# Patient Record
Sex: Female | Born: 1965 | Race: White | Hispanic: No | Marital: Married | Smoking: Never smoker
Health system: Southern US, Community
[De-identification: ages and names within clinical notes are randomized; demographics above are authoritative.]

## PROBLEM LIST (undated history)

## (undated) DIAGNOSIS — I639 Cerebral infarction, unspecified: Secondary | ICD-10-CM

## (undated) DIAGNOSIS — I10 Essential (primary) hypertension: Secondary | ICD-10-CM

## (undated) DIAGNOSIS — R519 Headache, unspecified: Secondary | ICD-10-CM

---

## 1998-11-09 ENCOUNTER — Other Ambulatory Visit: Admission: RE | Admit: 1998-11-09 | Discharge: 1998-11-09 | Payer: Self-pay | Admitting: Gynecology

## 2003-07-04 ENCOUNTER — Other Ambulatory Visit: Payer: Self-pay

## 2010-05-18 ENCOUNTER — Ambulatory Visit: Payer: Self-pay | Admitting: Gastroenterology

## 2010-08-01 ENCOUNTER — Ambulatory Visit: Payer: Self-pay | Admitting: Nephrology

## 2012-09-03 ENCOUNTER — Other Ambulatory Visit: Payer: Self-pay | Admitting: Bariatrics

## 2012-09-03 DIAGNOSIS — G4730 Sleep apnea, unspecified: Secondary | ICD-10-CM

## 2012-09-03 DIAGNOSIS — I10 Essential (primary) hypertension: Secondary | ICD-10-CM

## 2012-09-05 ENCOUNTER — Ambulatory Visit: Payer: Self-pay | Admitting: Bariatrics

## 2012-09-05 LAB — CBC WITH DIFFERENTIAL/PLATELET
Basophil %: 0.8 %
Eosinophil #: 0.2 10*3/uL (ref 0.0–0.7)
Eosinophil %: 2.9 %
HCT: 36.4 % (ref 35.0–47.0)
Lymphocyte %: 38.7 %
MCH: 31 pg (ref 26.0–34.0)
MCV: 89 fL (ref 80–100)
Monocyte #: 0.3 x10 3/mm (ref 0.2–0.9)
Monocyte %: 5.6 %
Neutrophil %: 52 %
Platelet: 236 10*3/uL (ref 150–440)
RBC: 4.12 10*6/uL (ref 3.80–5.20)
RDW: 13.2 % (ref 11.5–14.5)
WBC: 5.8 10*3/uL (ref 3.6–11.0)

## 2012-09-05 LAB — COMPREHENSIVE METABOLIC PANEL
Albumin: 3.8 g/dL (ref 3.4–5.0)
Alkaline Phosphatase: 59 U/L (ref 50–136)
Chloride: 104 mmol/L (ref 98–107)
EGFR (Non-African Amer.): >60
Osmolality: 276 (ref 275–301)
SGPT (ALT): 32 U/L (ref 12–78)

## 2012-09-05 LAB — IRON AND TIBC
Iron Bind.Cap.(Total): 372 ug/dL (ref 250–450)
Iron Saturation: 18 %
Iron: 68 ug/dL (ref 50–170)

## 2012-09-05 LAB — HEMOGLOBIN A1C: Hemoglobin A1C: 5.2 % (ref 4.2–6.3)

## 2012-09-05 LAB — FOLATE: Folic Acid: 31.4 ng/mL (ref 3.1–100.0)

## 2012-09-05 LAB — TSH: Thyroid Stimulating Horm: 1.48 u[IU]/mL

## 2012-09-05 LAB — PROTIME-INR: INR: 1

## 2012-09-16 ENCOUNTER — Other Ambulatory Visit: Payer: Self-pay | Admitting: Bariatrics

## 2012-09-16 ENCOUNTER — Ambulatory Visit
Admission: RE | Admit: 2012-09-16 | Discharge: 2012-09-16 | Disposition: A | Discharge status: Home / Self Care | Payer: BC Managed Care – PPO | Source: Ambulatory Visit | Attending: Bariatrics | Admitting: Bariatrics

## 2012-09-16 DIAGNOSIS — I10 Essential (primary) hypertension: Secondary | ICD-10-CM

## 2012-09-16 DIAGNOSIS — G4730 Sleep apnea, unspecified: Secondary | ICD-10-CM

## 2012-09-17 ENCOUNTER — Ambulatory Visit
Admission: RE | Admit: 2012-09-17 | Discharge: 2012-09-17 | Disposition: A | Discharge status: Home / Self Care | Payer: BC Managed Care – PPO | Source: Ambulatory Visit | Attending: Bariatrics | Admitting: Bariatrics

## 2012-09-17 DIAGNOSIS — I10 Essential (primary) hypertension: Secondary | ICD-10-CM

## 2012-09-17 DIAGNOSIS — G4730 Sleep apnea, unspecified: Secondary | ICD-10-CM

## 2012-10-09 ENCOUNTER — Ambulatory Visit: Payer: Self-pay | Admitting: General Practice

## 2012-11-05 ENCOUNTER — Ambulatory Visit: Payer: Self-pay | Admitting: General Practice

## 2013-01-20 ENCOUNTER — Ambulatory Visit: Payer: Self-pay | Admitting: General Practice

## 2013-02-05 ENCOUNTER — Ambulatory Visit: Payer: Self-pay | Admitting: General Practice

## 2013-03-20 ENCOUNTER — Ambulatory Visit: Payer: Self-pay | Admitting: Family Medicine

## 2015-12-16 DIAGNOSIS — G43009 Migraine without aura, not intractable, without status migrainosus: Secondary | ICD-10-CM | POA: Diagnosis present

## 2016-03-16 ENCOUNTER — Other Ambulatory Visit: Payer: Self-pay | Admitting: Family Medicine

## 2016-03-16 DIAGNOSIS — Z1231 Encounter for screening mammogram for malignant neoplasm of breast: Secondary | ICD-10-CM

## 2016-03-20 ENCOUNTER — Ambulatory Visit: Admission: RE | Admit: 2016-03-20 | Payer: BC Managed Care – PPO | Source: Ambulatory Visit

## 2016-03-28 ENCOUNTER — Ambulatory Visit
Admission: RE | Admit: 2016-03-28 | Discharge: 2016-03-28 | Disposition: A | Discharge status: Home / Self Care | Payer: BC Managed Care – PPO | Source: Ambulatory Visit | Attending: Family Medicine | Admitting: Family Medicine

## 2016-03-28 ENCOUNTER — Encounter: Payer: Self-pay | Admitting: Radiology

## 2016-03-28 DIAGNOSIS — Z1231 Encounter for screening mammogram for malignant neoplasm of breast: Secondary | ICD-10-CM | POA: Insufficient documentation

## 2017-03-14 ENCOUNTER — Other Ambulatory Visit: Payer: Self-pay | Admitting: Neurology

## 2017-03-14 DIAGNOSIS — G44059 Short lasting unilateral neuralgiform headache with conjunctival injection and tearing (SUNCT), not intractable: Secondary | ICD-10-CM

## 2017-03-21 ENCOUNTER — Ambulatory Visit
Admission: RE | Admit: 2017-03-21 | Discharge: 2017-03-21 | Disposition: A | Discharge status: Home / Self Care | Payer: BC Managed Care – PPO | Source: Ambulatory Visit | Attending: Neurology | Admitting: Neurology

## 2017-03-21 DIAGNOSIS — G44059 Short lasting unilateral neuralgiform headache with conjunctival injection and tearing (SUNCT), not intractable: Secondary | ICD-10-CM

## 2017-03-21 DIAGNOSIS — R51 Headache: Secondary | ICD-10-CM | POA: Diagnosis not present

## 2017-03-21 DIAGNOSIS — R4701 Aphasia: Secondary | ICD-10-CM | POA: Diagnosis not present

## 2017-05-03 DIAGNOSIS — R200 Anesthesia of skin: Secondary | ICD-10-CM | POA: Insufficient documentation

## 2019-02-10 ENCOUNTER — Other Ambulatory Visit: Payer: Self-pay | Admitting: Physician Assistant

## 2019-02-10 DIAGNOSIS — Z1231 Encounter for screening mammogram for malignant neoplasm of breast: Secondary | ICD-10-CM

## 2019-03-04 ENCOUNTER — Other Ambulatory Visit: Payer: Self-pay

## 2019-03-04 ENCOUNTER — Ambulatory Visit
Admission: RE | Admit: 2019-03-04 | Discharge: 2019-03-04 | Disposition: A | Discharge status: Home / Self Care | Payer: BC Managed Care – PPO | Source: Ambulatory Visit | Attending: Physician Assistant | Admitting: Physician Assistant

## 2019-03-04 DIAGNOSIS — Z1231 Encounter for screening mammogram for malignant neoplasm of breast: Secondary | ICD-10-CM

## 2020-02-29 ENCOUNTER — Other Ambulatory Visit: Payer: Self-pay

## 2020-02-29 ENCOUNTER — Emergency Department: Payer: BC Managed Care – PPO

## 2020-02-29 ENCOUNTER — Encounter: Payer: Self-pay | Admitting: Intensive Care

## 2020-02-29 ENCOUNTER — Observation Stay
Admission: EM | Admit: 2020-02-29 | Discharge: 2020-03-01 | Disposition: A | Discharge status: Home / Self Care | Payer: BC Managed Care – PPO | Attending: Internal Medicine | Admitting: Internal Medicine

## 2020-02-29 ENCOUNTER — Observation Stay: Payer: BC Managed Care – PPO

## 2020-02-29 DIAGNOSIS — R29818 Other symptoms and signs involving the nervous system: Secondary | ICD-10-CM | POA: Diagnosis not present

## 2020-02-29 DIAGNOSIS — R55 Syncope and collapse: Secondary | ICD-10-CM | POA: Diagnosis not present

## 2020-02-29 DIAGNOSIS — Z79899 Other long term (current) drug therapy: Secondary | ICD-10-CM | POA: Insufficient documentation

## 2020-02-29 DIAGNOSIS — E538 Deficiency of other specified B group vitamins: Secondary | ICD-10-CM | POA: Diagnosis not present

## 2020-02-29 DIAGNOSIS — G43009 Migraine without aura, not intractable, without status migrainosus: Secondary | ICD-10-CM | POA: Diagnosis not present

## 2020-02-29 DIAGNOSIS — I639 Cerebral infarction, unspecified: Secondary | ICD-10-CM | POA: Diagnosis not present

## 2020-02-29 DIAGNOSIS — R410 Disorientation, unspecified: Secondary | ICD-10-CM | POA: Diagnosis present

## 2020-02-29 DIAGNOSIS — I10 Essential (primary) hypertension: Secondary | ICD-10-CM

## 2020-02-29 DIAGNOSIS — Z20822 Contact with and (suspected) exposure to covid-19: Secondary | ICD-10-CM | POA: Insufficient documentation

## 2020-02-29 DIAGNOSIS — G9341 Metabolic encephalopathy: Secondary | ICD-10-CM | POA: Diagnosis not present

## 2020-02-29 DIAGNOSIS — R471 Dysarthria and anarthria: Secondary | ICD-10-CM | POA: Diagnosis not present

## 2020-02-29 DIAGNOSIS — F32A Depression, unspecified: Secondary | ICD-10-CM | POA: Insufficient documentation

## 2020-02-29 DIAGNOSIS — I6781 Acute cerebrovascular insufficiency: Secondary | ICD-10-CM | POA: Diagnosis not present

## 2020-02-29 DIAGNOSIS — R6889 Other general symptoms and signs: Secondary | ICD-10-CM

## 2020-02-29 DIAGNOSIS — R2990 Unspecified symptoms and signs involving the nervous system: Secondary | ICD-10-CM | POA: Diagnosis not present

## 2020-02-29 HISTORY — DX: Cerebral infarction, unspecified: I63.9

## 2020-02-29 HISTORY — DX: Essential (primary) hypertension: I10

## 2020-02-29 HISTORY — DX: Headache, unspecified: R51.9

## 2020-02-29 LAB — CBC
HCT: 38.5 % (ref 36.0–46.0)
HCT: 38.6 % (ref 36.0–46.0)
Hemoglobin: 12.9 g/dL (ref 12.0–15.0)
Hemoglobin: 13 g/dL (ref 12.0–15.0)
MCH: 30.5 pg (ref 26.0–34.0)
MCH: 30.7 pg (ref 26.0–34.0)
MCHC: 33.5 g/dL (ref 30.0–36.0)
MCHC: 33.7 g/dL (ref 30.0–36.0)
MCV: 91 fL (ref 80.0–100.0)
MCV: 91 fL (ref 80.0–100.0)
Platelets: 190 10*3/uL (ref 150–400)
Platelets: 160 10*3/uL (ref 150–400)
RBC: 4.23 MIL/uL (ref 3.87–5.11)
RBC: 4.24 MIL/uL (ref 3.87–5.11)
RDW: 11.9 % (ref 11.5–15.5)
RDW: 11.9 % (ref 11.5–15.5)
WBC: 4.2 10*3/uL (ref 4.0–10.5)
WBC: 4.4 10*3/uL (ref 4.0–10.5)
nRBC: 0 % (ref 0.0–0.2)
nRBC: 0 % (ref 0.0–0.2)

## 2020-02-29 LAB — ACETAMINOPHEN LEVEL: Acetaminophen (Tylenol), Serum: <10 ug/mL — ABNORMAL LOW (ref 10–30)

## 2020-02-29 LAB — PROTIME-INR
INR: 1 (ref 0.8–1.2)
Prothrombin Time: 12.5 seconds (ref 11.4–15.2)

## 2020-02-29 LAB — SARS CORONAVIRUS 2 BY RT PCR (HOSPITAL ORDER, PERFORMED IN ~~LOC~~ HOSPITAL LAB): SARS Coronavirus 2: NEGATIVE

## 2020-02-29 LAB — COMPREHENSIVE METABOLIC PANEL
ALT: 39 U/L (ref 0–44)
AST: 38 U/L (ref 15–41)
Albumin: 4.3 g/dL (ref 3.5–5.0)
Alkaline Phosphatase: 84 U/L (ref 38–126)
Anion gap: 10 (ref 5–15)
BUN: 14 mg/dL (ref 6–20)
CO2: 26 mmol/L (ref 22–32)
Calcium: 9.5 mg/dL (ref 8.9–10.3)
Chloride: 105 mmol/L (ref 98–111)
Creatinine, Ser: 0.8 mg/dL (ref 0.44–1.00)
GFR, Estimated: >60 mL/min (ref >60)
Glucose, Bld: 102 mg/dL — ABNORMAL HIGH (ref 70–99)
Potassium: 4.1 mmol/L (ref 3.5–5.1)
Sodium: 141 mmol/L (ref 135–145)
Total Bilirubin: 0.7 mg/dL (ref 0.3–1.2)
Total Protein: 7.4 g/dL (ref 6.5–8.1)

## 2020-02-29 LAB — ETHANOL: Alcohol, Ethyl (B): <10 mg/dL (ref <10)

## 2020-02-29 LAB — CREATININE, SERUM
Creatinine, Ser: 0.91 mg/dL (ref 0.44–1.00)
GFR, Estimated: >60 mL/min (ref >60)

## 2020-02-29 LAB — POC SARS CORONAVIRUS 2 AG -  ED: SARS Coronavirus 2 Ag: NEGATIVE

## 2020-02-29 LAB — SALICYLATE LEVEL: Salicylate Lvl: <7 mg/dL — ABNORMAL LOW (ref 7.0–30.0)

## 2020-02-29 MED ORDER — SODIUM CHLORIDE 0.9 % IV SOLN: INTRAVENOUS | Status: DC

## 2020-02-29 MED ORDER — IOHEXOL 350 MG/ML SOLN
100.0000 mL | Freq: Once | INTRAVENOUS | Status: AC | PRN
  Administered 2020-02-29: 100 mL via INTRAVENOUS

## 2020-02-29 MED ORDER — ACETAMINOPHEN 160 MG/5ML PO SOLN
650.0000 mg | ORAL | Status: DC | PRN
  Filled 2020-02-29: qty 20.3

## 2020-02-29 MED ORDER — ACETAMINOPHEN 650 MG RE SUPP: 650.0000 mg | RECTAL | Status: DC | PRN

## 2020-02-29 MED ORDER — SUMATRIPTAN SUCCINATE 50 MG PO TABS
50.0000 mg | ORAL_TABLET | Freq: Every day | ORAL | Status: DC | PRN
  Filled 2020-02-29: qty 2

## 2020-02-29 MED ORDER — ASPIRIN EC 81 MG PO TBEC: 81.0000 mg | DELAYED_RELEASE_TABLET | Freq: Every day | ORAL | Status: DC

## 2020-02-29 MED ORDER — STROKE: EARLY STAGES OF RECOVERY BOOK: Freq: Once | Status: DC

## 2020-02-29 MED ORDER — ENOXAPARIN SODIUM 40 MG/0.4ML ~~LOC~~ SOLN
40.0000 mg | SUBCUTANEOUS | Status: DC
  Administered 2020-02-29: 40 mg via SUBCUTANEOUS
  Filled 2020-02-29: qty 0.4

## 2020-02-29 MED ORDER — ASPIRIN 300 MG RE SUPP
300.0000 mg | Freq: Once | RECTAL | Status: AC
  Administered 2020-02-29: 300 mg via RECTAL
  Filled 2020-02-29: qty 1

## 2020-02-29 MED ORDER — ACETAMINOPHEN 325 MG PO TABS: 650.0000 mg | ORAL_TABLET | ORAL | Status: DC | PRN

## 2020-02-29 MED ORDER — GADOBUTROL 1 MMOL/ML IV SOLN
7.0000 mL | Freq: Once | INTRAVENOUS | Status: AC | PRN
  Administered 2020-02-29: 7 mL via INTRAVENOUS

## 2020-02-29 MED ORDER — LACTATED RINGERS IV BOLUS
1000.0000 mL | Freq: Once | INTRAVENOUS | Status: AC
  Administered 2020-02-29: 1000 mL via INTRAVENOUS

## 2020-02-29 MED ORDER — ATORVASTATIN CALCIUM 20 MG PO TABS
40.0000 mg | ORAL_TABLET | Freq: Every day | ORAL | Status: DC
  Administered 2020-02-29: 40 mg via ORAL
  Filled 2020-02-29: qty 2

## 2020-02-29 NOTE — ED Notes (Signed)
Called codee stroke to [facility], Kim  [phone]

## 2020-02-29 NOTE — ED Notes (Signed)
Patient back to room from CT

## 2020-02-29 NOTE — ED Triage Notes (Signed)
First nurse note: Patient to ER from home via [facility] for c/o syncope/collapse. Patient was standing up when fire department arrived, had another syncopal episode. Patient's systolic was 154 lying, 122 sitting. HR went from 68-94 with sitting/standing from lying position (+orthostatic). CBG for EMS was 89. Patient reports to EMS that she has not eaten today.

## 2020-02-29 NOTE — ED Notes (Signed)
Tele cart at bedside.

## 2020-02-29 NOTE — ED Notes (Signed)
Patient to MRI at this time.

## 2020-02-29 NOTE — ED Notes (Signed)
[place], MD at bedside. 

## 2020-02-29 NOTE — Consult Note (Signed)
[facility] Telemedicine Consult   Requesting Provider: Dr Scotty Court Consult Participants: Dr. Marthe Patch, Telespecialist RN Jeanice Lim, Bedside RN at [facility], Husband Location of the provider: Home Location of the patient: ER [facility]  This consult was provided via telemedicine with 2-way video and audio communication. The patient/family was informed that care would be provided in this way and agreed to receive care in this manner.    Chief Complaint: AMS, Left eye movement restricion HPI: The patient is a 55 year old with past medical history of migraines, headaches, hypertension, bilateral carpal tunnel syndrome, presenting to the emergency room for initially for evaluation of a syncopal-like episode but later on history taking by the ED provider once he was able to get to the patient of difficulty with speaking as well as inability to move the left eye completely. The patient's husband is the one who provided most history.  She was last seen well by him at 6 AM when he left work and they said their goodbyes.  She kept on sleeping on the couch and he found her there in the afternoon.  She was brought into the emergency room sometime after 2 PM but not evaluated by a provider until later in the evening and on his evaluation, code stroke was activated because he found an element of aphasia as well as eye movement abnormality as documented above and below. The patient's husband said that she had been sick with congestive symptoms lately and took a codeine/Tylenol combination pill for cough this morning-that was her first codeine pill. She has a history of migraines but has not had too many migraines with any cognitive symptoms or complex migraine examining symptoms. Chart review reveals that 3 to 4 years ago she had a spell of altered cognition where she was completely confused at 10 PM at night, did not know what was going on and went to bed but woke up in the morning with symptoms resolved without any  recollection of the event. MRI of the brain and EEG were unremarkable. She followed with neurology at [facility], primarily for headache and bilateral carpal tunnel syndrome as well as an episode of altered consciousness. Husband also reports that she has mild depression with no recent stressors either personal or at work.   Works as a Neurosurgeon.  No recent Covid exposures.  Has received 2 doses of Covid vaccine and a booster about 2-1/2 months ago.     Past Medical History:  Diagnosis Date  . Hypertension     No current facility-administered medications for this encounter.  Current Outpatient Medications:  Marland Kitchen  Guaifenesin (MUCINEX MAXIMUM STRENGTH) 1200 MG TB12, Take 1,200 mg by mouth every 12 (twelve) hours as needed (congestion)., Disp: , Rfl:  .  losartan (COZAAR) 100 MG tablet, Take 100 mg by mouth daily., Disp: , Rfl:  .  Multiple Vitamins-Minerals (MULTIVITAMIN GUMMIES WOMENS) CHEW, Chew 1 tablet by mouth daily., Disp: , Rfl:  .  nortriptyline (PAMELOR) 10 MG capsule, Take 20 mg by mouth at bedtime., Disp: , Rfl:  .  SUMAtriptan (IMITREX) 100 MG tablet, Take 50-100 mg by mouth daily as needed for migraine. May repeat in 2 hours if headache persists or recurs., Disp: , Rfl:  .  topiramate (TOPAMAX) 50 MG tablet, Take 50 mg by mouth 2 (two) times daily., Disp: , Rfl:  .  venlafaxine XR (EFFEXOR-XR) 150 MG 24 hr capsule, Take 150 mg by mouth daily., Disp: , Rfl:  .  vitamin B-12 (CYANOCOBALAMIN) 1000 MCG tablet, Take  1,000 mcg by mouth daily., Disp: , Rfl:  .  vitamin C (ASCORBIC ACID) 500 MG tablet, Take 500 mg by mouth daily., Disp: , Rfl:   LKW: 6 AM on 02/29/2020 tpa given?: No, outside the window IR Thrombectomy? No, no ELVO Modified Rankin Scale: 0-Completely asymptomatic and back to baseline post- stroke Time of teleneurologist evaluation: 1857 hrs. on 02/29/2020  Exam: Vitals:   02/29/20 1545 02/29/20 1835  BP: (!) 143/95 (!) 142/81  Pulse: 75 75  Resp: 14 20   Temp: 99.1 F (37.3 C)   SpO2: 100% 100%    General: Drowsy, laying in bed in no acute distress HEENT: Appears normocephalic and atraumatic Cardiovascular system: Regular rate rhythm on the monitor Respiratory: Breathing well and saturating normally on room air Neurological exam Mildly drowsy Able to participate in conversation but appears to have poor attention concentration Follows all commands Able to tell me her correct age but not the correct month. No gross evidence of aphasia Speech moderately dysarthric Cranial nerves: Pupils equal round reactive to light on the bedside examination, extraocular movement exam reveals a left INO with the left eye unable to cross the midline to look medially.  She is complains of diplopia on neutral gaze.  Face appears symmetric Motor exam: No drift in any of the 4 extremities Sensory exam: Intact light touch with no extinction Coordination: Mildly ataxic on bilateral finger-nose-finger testing   NIHSS 1A: Level of Consciousness - 1 1B: Ask Month and Age - 1 1C: 'Blink Eyes' & 'Squeeze Hands' - 0 2: Test Horizontal Extraocular Movements - 1 3: Test Visual Fields - 0 4: Test Facial Palsy - 0 5A: Test Left Arm Motor Drift - 0 5B: Test Right Arm Motor Drift - 0 6A: Test Left Leg Motor Drift - 0 6B: Test Right Leg Motor Drift - 0 7: Test Limb Ataxia -2 8: Test Sensation - 0 9: Test Language/Aphasia- 0 10: Test Dysarthria - 1 11: Test Extinction/Inattention - 0 NIHSS score: 6   Imaging Reviewed: CT head with no acute changes-aspects 10  Labs reviewed in epic and pertinent values follow: CBC    Component Value Date/Time   WBC 4.2 02/29/2020 1550   RBC 4.23 02/29/2020 1550   HGB 12.9 02/29/2020 1550   HGB 12.8 09/05/2012 0849   HCT 38.5 02/29/2020 1550   HCT 36.4 09/05/2012 0849   PLT 190 02/29/2020 1550   PLT 236 09/05/2012 0849   MCV 91.0 02/29/2020 1550   MCV 89 09/05/2012 0849   MCH 30.5 02/29/2020 1550   MCHC 33.5  02/29/2020 1550   RDW 11.9 02/29/2020 1550   RDW 13.2 09/05/2012 0849   LYMPHSABS 2.2 09/05/2012 0849   MONOABS 0.3 09/05/2012 0849   EOSABS 0.2 09/05/2012 0849   BASOSABS 0.0 09/05/2012 0849   CMP     Component Value Date/Time   NA 141 02/29/2020 1550   NA 138 09/05/2012 0849   K 4.1 02/29/2020 1550   K 3.7 09/05/2012 0849   CL 105 02/29/2020 1550   CL 104 09/05/2012 0849   CO2 26 02/29/2020 1550   CO2 29 09/05/2012 0849   GLUCOSE 102 (H) 02/29/2020 1550   GLUCOSE 103 (H) 09/05/2012 0849   BUN 14 02/29/2020 1550   BUN 13 09/05/2012 0849   CREATININE 0.80 02/29/2020 1550   CREATININE 1.01 09/05/2012 0849   CALCIUM 9.5 02/29/2020 1550   CALCIUM 8.9 09/05/2012 0849   PROT 7.4 02/29/2020 1550   PROT 7.2 09/05/2012 0849  ALBUMIN 4.3 02/29/2020 1550   ALBUMIN 3.8 09/05/2012 0849   AST 38 02/29/2020 1550   AST 24 09/05/2012 0849   ALT 39 02/29/2020 1550   ALT 32 09/05/2012 0849   ALKPHOS 84 02/29/2020 1550   ALKPHOS 59 09/05/2012 0849   BILITOT 0.7 02/29/2020 1550   BILITOT 0.5 09/05/2012 0849   GFRNONAA >60 02/29/2020 1550   GFRNONAA >60 09/05/2012 0849   GFRAA >60 09/05/2012 0849  Initial labs unremarkable   Assessment:  55 year old with the above past medical history presenting initially for episode of altered consciousness but on further evaluation found to have some aphasia on the ED providers evaluation along with inability to cross the midline with her left eye to look medially. Code stroke activated for concern for LVO. On my examination, she is drowsy, follows all commands, appears to have a left INO but the husband also reported that her left eye has been lazy but not to this extent. Differentials at this time include side effects from opiate medications versus small stroke in the brainstem versus demyelinating disease-MS or NMO spectrum disorder, less likely ADEM given her age.  Given history of migraine, complex migraine is also something to be  considered.  Impression Strokelike symptoms-evaluate for medication overdose versus brainstem stroke versus demyelination Other differentials could include complex migraine  Recommendations:  Admit to observation under hospitalist Telemetry Frequent neurochecks MRI brain with and without contrast Toxicology screen Covid test Minimize sedating medications Pursue a stroke work-up only if the MRI is positive for stroke Discussed with EDP Dr. Scotty Court over the phone   This patient is receiving care for possible acute neurological changes.  There was 45 minutes of critical care by this provider at the time of service, including time for direct evaluation via telemedicine, review of medical records, imaging studies and discussion of findings with providers, the patient and/or family.  -- Milon Dikes, MD [facility] Pager: [phone] If 7pm to 7am, please call on call as listed on AMION.

## 2020-02-29 NOTE — H&P (Signed)
History and Physical    Pamela Clayton ZOX:[id number] DOB: April 19, 1965 DOA: 02/29/2020  PCP: Pamela Mould, NP   Patient coming from: home  I have personally briefly reviewed patient's old medical records in [facility]  Chief Complaint: Confusion, altered mental status  HPI: Pamela Clayton is a 55 y.o. 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 Patient's last known normal was at 6 AM on the day of arrival when the husband left for work however patient called him at 2 PM and he noted that she had slurring of the speech, was sounding confused and speaking in nonsensical sentences and being repetitive. EMS reports that patient syncopized with standing while they were there on the scene and she had positive orthostatics. Husband also reports that patient had apparently taken cough and cold medication with codeine. ED Course: Upon arrival, low-grade temperature of 99.1, BP 143/95 with pulse of 75 O2 sat 100% on room air. NIH score was 6 and code stroke was initiated with negative initial CT head. Blood work returned and was unremarkable including negative ethanol salicylate and acetaminophen levels, regular UDS pending. Covid negative. EKG as reviewed by me : NSR at 83 Imaging: CT head negative, CTA head and neck negative, MRI head and neck pending. Chest x-ray clear  Neurology was consulted from the emergency room and they recommended MRI head and neck, if negative no stroke work-up. Please refer to note for details. Hospitalist consulted for admission.   Review of Systems: As per HPI otherwise all other systems on review of systems negative.    Past Medical History:  Diagnosis Date  . Hypertension      History reviewed. No pertinent surgical history.   reports that she has never smoked. She has never used smokeless tobacco. She reports current alcohol use. She reports that she does not use drugs.  Not on File  Family History  Problem Relation Age of Onset  . Breast cancer Mother 23  . Breast cancer Paternal Grandmother       Prior to Admission medications   Medication Sig Start Date End Date Taking? Authorizing Provider  Guaifenesin (MUCINEX MAXIMUM STRENGTH) 1200 MG TB12 Take 1,200 mg by mouth every 12 (twelve) hours as needed (congestion).   Yes [provider]  losartan (COZAAR) 100 MG tablet Take 100 mg by mouth daily. 01/25/20 01/24/21 Yes [provider]  Multiple Vitamins-Minerals (MULTIVITAMIN GUMMIES WOMENS) CHEW Chew 1 tablet by mouth daily.   Yes [provider]  nortriptyline (PAMELOR) 10 MG capsule Take 20 mg by mouth at bedtime. 02/09/20  Yes [provider]  SUMAtriptan (IMITREX) 100 MG tablet Take 50-100 mg by mouth daily as needed for migraine. May repeat in 2 hours if headache persists or recurs.   Yes [provider]  topiramate (TOPAMAX) 50 MG tablet Take 50 mg by mouth 2 (two) times daily. 02/09/20  Yes [provider]  venlafaxine XR (EFFEXOR-XR) 150 MG 24 hr capsule Take 150 mg by mouth daily. 01/26/20  Yes [provider]  vitamin B-12 (CYANOCOBALAMIN) 1000 MCG tablet Take 1,000 mcg by mouth daily.   Yes [provider]  vitamin C (ASCORBIC ACID) 500 MG tablet Take 500 mg by mouth daily.   Yes  [provider]    Physical Exam: Vitals:   02/29/20 1835 02/29/20 1930 02/29/20 2015 02/29/20 2030  BP: (!) 142/81 136/80 (!) 148/93 (!) 153/88  Pulse: 75 81 89 70  Resp: 20 15 17 16   Temp:      TempSrc:      SpO2: 100% 100% 100% 97%  Weight:      Height:         Vitals:   02/29/20 1835 02/29/20 1930 02/29/20 2015 02/29/20 2030  BP: (!) 142/81 136/80 (!) 148/93 (!) 153/88   Pulse: 75 81 89 70  Resp: 20 15 17 16   Temp:      TempSrc:      SpO2: 100% 100% 100% 97%  Weight:      Height:          Constitutional:  Patient will not open eyes when spoken to.   oriented x 3 . Not in any apparent distress HEENT:      Head: Normocephalic and atraumatic.         Eyes: PERLA, EOMI, Conjunctivae are normal. Sclera is non-icteric.       Mouth/Throat: Mucous membranes are moist.       Neck: Supple with no signs of meningismus. Cardiovascular: Regular rate and rhythm. No murmurs, gallops, or rubs. 2+ symmetrical distal pulses are present . No JVD. No LE edema Respiratory: Respiratory effort normal .Lungs sounds clear bilaterally. No wheezes, crackles, or rhonchi.  Gastrointestinal: Soft, non tender, and non distended with positive bowel sounds.  Genitourinary: No CVA tenderness. Musculoskeletal: Nontender with normal range of motion in all extremities. No cyanosis, or erythema of extremities. Neurologic:  Face is symmetric. Moving all extremities. No gross focal neurologic deficits . Skin: Skin is warm, dry.  No rash or ulcers Psychiatric: Somewhat flat affect l    Labs on Admission: I have personally reviewed following labs and imaging studies  CBC: Recent Labs  Lab 02/29/20 1550  WBC 4.2  HGB 12.9  HCT 38.5  MCV 91.0  PLT 190   Basic Metabolic Panel: Recent Labs  Lab 02/29/20 1550  NA 141  K 4.1  CL 105  CO2 26  GLUCOSE 102*  BUN 14  CREATININE 0.80  CALCIUM 9.5   GFR: Estimated Creatinine Clearance: 81.1 mL/min (by C-G formula based on SCr of 0.8 mg/dL). Liver Function Tests: Recent Labs  Lab 02/29/20 1550  AST 38  ALT 39  ALKPHOS 84  BILITOT 0.7  PROT 7.4  ALBUMIN 4.3   No results for input(s): LIPASE, AMYLASE in the last 168 hours. No results for input(s): AMMONIA in the last 168 hours. Coagulation Profile: Recent Labs  Lab 02/29/20 1550  INR 1.0   Cardiac Enzymes: No results for input(s): CKTOTAL, CKMB, CKMBINDEX,  TROPONINI in the last 168 hours. BNP (last 3 results) No results for input(s): PROBNP in the last 8760 hours. HbA1C: No results for input(s): HGBA1C in the last 72 hours. CBG: No results for input(s): GLUCAP in the last 168 hours. Lipid Profile: No results for input(s): CHOL, HDL, LDLCALC, TRIG, CHOLHDL, LDLDIRECT in the last 72 hours. Thyroid Function Tests: No results for input(s): TSH, T4TOTAL, FREET4, T3FREE, THYROIDAB in the last 72 hours. Anemia Panel: No results for input(s): VITAMINB12, FOLATE, FERRITIN, TIBC, IRON, RETICCTPCT in the last 72 hours. Urine analysis: No results found for: COLORURINE, APPEARANCEUR, LABSPEC, PHURINE, GLUCOSEU, HGBUR, BILIRUBINUR, KETONESUR, PROTEINUR, UROBILINOGEN, NITRITE, LEUKOCYTESUR  Radiological Exams on Admission: CT Code Stroke CTA Head W/WO contrast  Result Date:  02/29/2020 CLINICAL DATA:  Syncopal episode.  Lethargy. EXAM: CT ANGIOGRAPHY HEAD AND NECK CT PERFUSION BRAIN TECHNIQUE: Multidetector CT imaging of the head and neck was performed using the standard protocol during bolus administration of intravenous contrast. Multiplanar CT image reconstructions and MIPs were obtained to evaluate the vascular anatomy. Carotid stenosis measurements (when applicable) are obtained utilizing NASCET criteria, using the distal internal carotid diameter as the denominator. Multiphase CT imaging of the brain was performed following IV bolus contrast injection. Subsequent parametric perfusion maps were calculated using RAPID software. CONTRAST:  OMNIPAQUE IOHEXOL 350 MG/ML SOLN COMPARISON:  Head CT earlier same day.  MRI 03/21/2017. FINDINGS: CTA NECK FINDINGS Aortic arch: Normal Right carotid system: Normal. No bifurcation disease. Cervical ICA widely patent. Left carotid system: Normal. No bifurcation disease. Cervical ICA widely patent. Vertebral arteries: Both vertebral artery origins widely patent. Both vertebral arteries normal through the cervical region  to the foramen magnum. Skeleton: Mild midcervical spondylosis.  No significant finding. Other neck: No mass or lymphadenopathy. Upper chest: Normal Review of the MIP images confirms the above findings CTA HEAD FINDINGS Anterior circulation: Both internal carotid arteries are patent through the skull base and siphon regions. There is some siphon atherosclerotic calcification but no stenosis greater than 30%. The anterior and middle cerebral vessels are patent. No large or medium vessel occlusion. Posterior circulation: Both vertebral arteries are widely patent through the foramen magnum to the basilar. No basilar stenosis. Posterior circulation branch vessels are normal. Venous sinuses: Patent and normal. Anatomic variants: None significant. Review of the MIP images confirms the above findings CT Brain Perfusion Findings: ASPECTS: 10 CBF (<30%) Volume: 6mL Perfusion (Tmax>6.0s) volume: 49mL Mismatch Volume: None Infarction Location:None IMPRESSION: Normal CT angiography of the neck and head, other than ordinary atherosclerotic calcification in the carotid siphon regions but without significant narrowing. No large or medium vessel occlusion. Normal perfusion study. Electronically Signed   By: Paulina Fusi M.D.   On: 02/29/2020 19:38   CT Head Wo Contrast  Addendum Date: 02/29/2020   ADDENDUM REPORT: 02/29/2020 19:23 ADDENDUM: I was subsequently contacted by Dr. Milon Dikes of neurology who informed me that a code stroke was activated for this patient shortly after my interpretation of the non-contrast head CT. There is an ASPECTS of 10 this examination. Electronically Signed   By: Jackey Loge DO   On: 02/29/2020 19:23   Result Date: 02/29/2020 CLINICAL DATA:  Mental status change, persistent or worsening. Additional history provided: Syncopal episode, disorientation EXAM: CT HEAD WITHOUT CONTRAST TECHNIQUE: Contiguous axial images were obtained from the base of the skull through the vertex without intravenous  contrast. COMPARISON:  Brain MRI 03/21/2017. Report from head CT 03/22/1999 (images unavailable). FINDINGS: Brain: Cerebral volume is normal for age. Mild ill-defined hypoattenuation within the cerebral white matter is nonspecific, but most often secondary to chronic small vessel ischemia. There is no acute intracranial hemorrhage. No demarcated cortical infarct. No extra-axial fluid collection. No evidence of intracranial mass. No midline shift. Vascular: No hyperdense vessel.  Atherosclerotic calcifications. Skull: Chronic of the left frontoparietal calvarium, which may be posttraumatic (series 3, image 49). No acute calvarial fracture or focal suspicious osseous lesion. Sinuses/Orbits: Visualized orbits show no acute finding. Paranasal sinus disease at the imaged levels. Most notably, moderate mucosal thickening and a small air-fluid level are present within the right sphenoid sinus. IMPRESSION: No evidence of acute intracranial abnormality. Redemonstrated mild cerebral white matter disease which is nonspecific, but most often related to chronic small vessel ischemia. 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 Correlate with the medical history Paranasal sinus disease, most notably right sphenoid sinusitis. Electronically Signed: By: Jackey Loge DO On: 02/29/2020 16:23   CT Code Stroke CTA Neck W/WO contrast  Result Date: 02/29/2020 CLINICAL DATA:  Syncopal episode.  Lethargy. EXAM: CT ANGIOGRAPHY HEAD AND NECK CT PERFUSION BRAIN TECHNIQUE: Multidetector CT imaging of the head and neck was performed using the standard protocol during bolus administration of intravenous contrast. Multiplanar CT image reconstructions and MIPs were obtained to evaluate the vascular anatomy. Carotid stenosis measurements (when applicable) are obtained utilizing NASCET criteria, using the distal internal carotid diameter as the denominator. Multiphase CT imaging of the brain was performed  following IV bolus contrast injection. Subsequent parametric perfusion maps were calculated using RAPID software. CONTRAST:  OMNIPAQUE IOHEXOL 350 MG/ML SOLN COMPARISON:  Head CT earlier same day.  MRI 03/21/2017. FINDINGS: CTA NECK FINDINGS Aortic arch: Normal Right carotid system: Normal. No bifurcation disease. Cervical ICA widely patent. Left carotid system: Normal. No bifurcation disease. Cervical ICA widely patent. Vertebral arteries: Both vertebral artery origins widely patent. Both vertebral arteries normal through the cervical region to the foramen magnum. Skeleton: Mild midcervical spondylosis.  No significant finding. Other neck: No mass or lymphadenopathy. Upper chest: Normal Review of the MIP images confirms the above findings CTA HEAD FINDINGS Anterior circulation: Both internal carotid arteries are patent through the skull base and siphon regions. There is some siphon atherosclerotic calcification but no stenosis greater than 30%. The anterior and middle cerebral vessels are patent. No large or medium vessel occlusion. Posterior circulation: Both vertebral arteries are widely patent through the foramen magnum to the basilar. No basilar stenosis. Posterior circulation branch vessels are normal. Venous sinuses: Patent and normal. Anatomic variants: None significant. Review of the MIP images confirms the above findings CT Brain Perfusion Findings: ASPECTS: 10 CBF (<30%) Volume: 32mL Perfusion (Tmax>6.0s) volume: 74mL Mismatch Volume: None Infarction Location:None IMPRESSION: Normal CT angiography of the neck and head, other than ordinary atherosclerotic calcification in the carotid siphon regions but without significant narrowing. No large or medium vessel occlusion. Normal perfusion study. Electronically Signed   By: Paulina Fusi M.D.   On: 02/29/2020 19:38   CT Code Stroke Cerebral Perfusion with contrast  Result Date: 02/29/2020 CLINICAL DATA:  Syncopal episode.  Lethargy. EXAM: CT ANGIOGRAPHY  HEAD AND NECK CT PERFUSION BRAIN TECHNIQUE: Multidetector CT imaging of the head and neck was performed using the standard protocol during bolus administration of intravenous contrast. Multiplanar CT image reconstructions and MIPs were obtained to evaluate the vascular anatomy. Carotid stenosis measurements (when applicable) are obtained utilizing NASCET criteria, using the distal internal carotid diameter as the denominator. Multiphase CT imaging of the brain was performed following IV bolus contrast injection. Subsequent parametric perfusion maps were calculated using RAPID software. CONTRAST:  OMNIPAQUE IOHEXOL 350 MG/ML SOLN COMPARISON:  Head CT earlier same day.  MRI 03/21/2017. FINDINGS: CTA NECK FINDINGS Aortic arch: Normal Right carotid system: Normal. No bifurcation disease. Cervical ICA widely patent. Left carotid system: Normal. No bifurcation disease. Cervical ICA widely patent. Vertebral arteries: Both vertebral artery origins widely patent. Both vertebral arteries normal through the cervical region to the foramen magnum. Skeleton: Mild midcervical spondylosis.  No significant finding. Other neck: No mass or lymphadenopathy. Upper chest: Normal Review of the MIP images confirms the above findings CTA HEAD FINDINGS Anterior circulation: Both internal carotid arteries are patent through the skull base and siphon regions. There is some siphon  atherosclerotic calcification but no stenosis greater than 30%. The anterior and middle cerebral vessels are patent. No large or medium vessel occlusion. Posterior circulation: Both vertebral arteries are widely patent through the foramen magnum to the basilar. No basilar stenosis. Posterior circulation branch vessels are normal. Venous sinuses: Patent and normal. Anatomic variants: None significant. Review of the MIP images confirms the above findings CT Brain Perfusion Findings: ASPECTS: 10 CBF (<30%) Volume: 0mL Perfusion (Tmax>6.0s) volume: 0mL Mismatch  Volume: None Infarction Location:None IMPRESSION: Normal CT angiography of the neck and head, other than ordinary atherosclerotic calcification in the carotid siphon regions but without significant narrowing. No large or medium vessel occlusion. Normal perfusion study. Electronically Signed   By: Paulina FusiMark  Shogry M.D.   On: 02/29/2020 19:38   DG Chest Portable 1 View  Result Date: 02/29/2020 CLINICAL DATA:  Cough EXAM: PORTABLE CHEST 1 VIEW COMPARISON:  09/05/2012 FINDINGS: The heart size and mediastinal contours are within normal limits. Both lungs are clear. The visualized skeletal structures are unremarkable. IMPRESSION: Negative. Electronically Signed   By: Charlett NoseKevin  Dover M.D.   On: 02/29/2020 19:52     Assessment/Plan 55 year old with history of HTN, migraines, depression, vitamin B deficiency, chronic hand numbness, with history of a spell of altered cognition in 2019 in which MRI showed small vessel white matter disease, presenting with slurred speech and confusion with a syncopal event witnessed by EMS. Patient's last known normal was at 6 AM 02/29/20 with ED evaluation occurring outside [facility] window    Acute focal neurological deficit, onset within 3-24 hours versus   Acute metabolic encephalopathy   History of Spell of altered cognition in 2019   Migraine without aura and without status migrainosus, not intractable -Patient with slurred speech, confusion, NIHSS of 6, with negative CT head and CTA neck, presenting in the context of recent codeine-containing cough and cold medication, history of spell of altered cognition of undetermined etiology in 2019 as well as history of migraine -Appreciate teleneurology consult and recommendations -For possible stroke: -Follow-up MRI brain and cancel stroke work-up if negative, per neurology recommendations -Patient given aspirin in the ER. Continue on aspirin and Lipitor for now -Hold home antihypertensives for permissive hypertension in case stroke ruled  in -Continue neurologic checks -Neurology consult in the a.m. -For possible acute medical encephalopathy: -Possibly medication related -Event was preceded by taking codeine-containing cough and cold medication, plus patient is on Topamax, venlafaxine, amitriptyline -Follow UDS -Neurologic checks -Fall and aspiration precautions -We'll keep on cardiac monitor and get echocardiogram given patient also had syncopal event -Neurology consult in the a.m.  Syncope - Patient had syncopal events on EMS arrival and was reportedly orthostatic  -Suspect vasovagal/from medication related event. -Gentle IV hydration -Follow echocardiogram, continuous cardiac monitoring -Follow UDS    HTN (hypertension) -Hold home losartan for now    Vitamin B12 deficiency -Patient gets monthly vitamin shots at her doctor's office  Migraines -Continue sumatriptan as needed -Daily Topamax  Depression -Continue Effexor ER  Bilateral hand numbness -Continue home meds. Appears to be on amitriptyline    DVT prophylaxis: Lovenox  Code Status: full code  Family Communication:  none  Disposition Plan: Back to previous home environment Consults called: Neurology Status:At the time of admission, it appears that the appropriate admission status for this patient is INPATIENT. This is judged to be reasonable and necessary in order to provide the required intensity of service to ensure the patient's safety given the presenting symptoms, physical exam findings, and initial radiographic and laboratory  data in the context of their  Comorbid conditions.   Patient requires inpatient status due to high intensity of service, high risk for further deterioration and high frequency of surveillance required.   I certify that at the point of admission it is my clinical judgment that the patient will require inpatient hospital care spanning beyond 2 midnights     Andris Baumann MD [facility]     02/29/2020, 8:43  PM

## 2020-02-29 NOTE — ED Triage Notes (Signed)
Patient arrived by EMS from home. EMS reported to first nurse patient had syncopal episode while they were present. Patient is lethargic. Alert to self, place, location. Disoriented to situation. Denies falling and hitting head at home. Denies taking any medication. Patient called her husband around 2pm and kept repeating the same phrase over and over and then he had 911 called. Bilateral strong grips. No weakness in bilateral legs. Some slurred speech

## 2020-02-29 NOTE — Progress Notes (Signed)
   02/29/20 1859  Clinical Encounter Type  Visited With Family;Patient not available  Visit Type Initial;Code  Referral From Nurse   Code Stroke page received for the patient. Upon arrival, the patient was away for scans; her husband was awaiting her return. The patient's husband shared that they were doing okay and would appreciate follow-up tomorrow. He also shared that they recently married in October. No additional needs at this time. Will pass request along to on-coming chaplain for follow-up.  Clovis Riley, Chaplain

## 2020-02-29 NOTE — ED Notes (Signed)
Patient placed on purewick and brief.

## 2020-02-29 NOTE — ED Notes (Signed)
Patient to CT with RN on cardiac monitor

## 2020-02-29 NOTE — ED Provider Notes (Signed)
[facility] Emergency Department Provider Note  ____________________________________________  Time seen: Approximately 8:41 PM  I have reviewed the triage vital signs and the nursing notes.   HISTORY  Chief Complaint Altered Mental Status    Level 5 Caveat: Portions of the History and Physical including HPI and review of systems are unable to be completely obtained due to patient altered mental status  HPI [name] is a 55 y.o. female with a history of hypertension, migraines, depression who is brought to the ED by EMS due to syncope at home, confusion.  Husband at bedside reports that when he left home at 6 AM patient was in her usual state of health and just waking up.  However, patient called him at 2:00 PM and was having difficulty speaking, slurring her words, speaking nonsensical sentences and being repetitive.  He called EMS who brought patient to the ED.  EMS reports that patient had syncope with standing while they were on scene with her.  They did orthostatics and found her vital signs to have significant change going from lying to sitting.   Has been also notes that she has been having nonproductive cough and congestion for the past 2 or 3 days and took a Tylenol with codeine last night.  She does not have any history of substance abuse   Past Medical History:  Diagnosis Date  . Hypertension      Patient Active Problem List   Diagnosis Date Noted  . History of Spell of altered cognition in 2019 02/29/2020  . HTN (hypertension) 02/29/2020  . Acute focal neurological deficit, onset within 3-24 hours 02/29/2020  . Vitamin B12 deficiency 02/29/2020  . Acute focal neurological deficit 02/29/2020  . Acute metabolic encephalopathy 02/29/2020  . Bilateral hand numbness 05/03/2017  . Migraine without aura and without status migrainosus, not intractable 12/16/2015     History reviewed. No pertinent surgical history.   Prior to Admission  medications   Medication Sig Start Date End Date Taking? Authorizing Provider  Guaifenesin (MUCINEX MAXIMUM STRENGTH) 1200 MG TB12 Take 1,200 mg by mouth every 12 (twelve) hours as needed (congestion).   Yes [provider]  losartan (COZAAR) 100 MG tablet Take 100 mg by mouth daily. 01/25/20 01/24/21 Yes [provider]  Multiple Vitamins-Minerals (MULTIVITAMIN GUMMIES WOMENS) CHEW Chew 1 tablet by mouth daily.   Yes [provider]  nortriptyline (PAMELOR) 10 MG capsule Take 20 mg by mouth at bedtime. 02/09/20  Yes [provider]  SUMAtriptan (IMITREX) 100 MG tablet Take 50-100 mg by mouth daily as needed for migraine. May repeat in 2 hours if headache persists or recurs.   Yes [provider]  topiramate (TOPAMAX) 50 MG tablet Take 50 mg by mouth 2 (two) times daily. 02/09/20  Yes [provider]  venlafaxine XR (EFFEXOR-XR) 150 MG 24 hr capsule Take 150 mg by mouth daily. 01/26/20  Yes [provider]  vitamin B-12 (CYANOCOBALAMIN) 1000 MCG tablet Take 1,000 mcg by mouth daily.   Yes [provider]  vitamin C (ASCORBIC ACID) 500 MG tablet Take 500 mg by mouth daily.   Yes [provider]     Allergies Patient has no allergy information on record.   Family History  Problem Relation Age of Onset  . Breast cancer Mother 51  . Breast cancer Paternal Grandmother     Social History Social History   Tobacco Use  . Smoking status: Never Smoker  . Smokeless tobacco: Never Used  Substance Use Topics  . Alcohol use: Yes    Comment: occ  . Drug use: Never    Review of Systems Level 5 Caveat: Portions of the History and Physical including HPI and review of systems are unable to be completely obtained due to patient being a poor historian   Constitutional:   No known fever.  ENT:   No rhinorrhea. Cardiovascular:   No chest pain or syncope. Respiratory:   No dyspnea or cough. Gastrointestinal:   Negative  for abdominal pain, vomiting and diarrhea.  Musculoskeletal:   Negative for focal pain or swelling ____________________________________________   PHYSICAL EXAM:  VITAL SIGNS: ED Triage Vitals  Enc Vitals Group     BP 02/29/20 1545 (!) 143/95     Pulse Rate 02/29/20 1545 75     Resp 02/29/20 1545 14     Temp 02/29/20 1545 99.1 F (37.3 C)     Temp Source 02/29/20 1545 Oral     SpO2 02/29/20 1545 100 %     Weight 02/29/20 1546 165 lb (74.8 kg)     Height 02/29/20 1546 5\' 8"  (1.727 m)     Head Circumference --      Peak Flow --      Pain Score 02/29/20 1546 0     Pain Loc --      Pain Edu? --      Excl. in GC? --     Vital signs reviewed, nursing assessments reviewed.   Constitutional:   Awake, follows commands.  Not oriented. Non-toxic appearance. Eyes:   Conjunctivae are normal.  Left medial rectus palsy.  Left afferent pupillary defect. funduscopy appears normal ENT      Head:   Normocephalic and atraumatic.      Nose:   No congestion/rhinnorhea.       Mouth/Throat:   MMM, no pharyngeal erythema. No peritonsillar mass.       Neck:   No meningismus. Full ROM. Hematological/Lymphatic/Immunilogical:   No cervical lymphadenopathy. Cardiovascular:   RRR. Symmetric bilateral radial and DP pulses.  No murmurs. Cap refill less than 2 seconds. Respiratory:   Normal respiratory effort without tachypnea/retractions. Breath sounds are clear and equal bilaterally. No wheezes/rales/rhonchi. Gastrointestinal:   Soft and nontender. Non distended. There is no CVA tenderness.  No rebound, rigidity, or guarding. Musculoskeletal:   Normal range of motion in all extremities. No joint effusions.  No lower extremity tenderness.  No edema. Neurologic:   Dysarthric speech.  Very limited language.  Apparent difficulty with word finding. Cranial nerves II and III impaired Motor grossly intact. Bilateral upper extremity ataxia NIH stroke scale 6 Skin:    Skin is warm, dry and intact. No rash  noted.  No petechiae, purpura, or bullae.  ____________________________________________    LABS (pertinent positives/negatives) (all labs ordered are listed, but only abnormal results are displayed) Labs Reviewed  COMPREHENSIVE METABOLIC PANEL - Abnormal; Notable for the following components:      Result Value   Glucose, Bld 102 (*)    All other components within normal limits  SALICYLATE LEVEL - Abnormal; Notable for the following components:   Salicylate Lvl <7.0 (*)    All other components within normal limits  ACETAMINOPHEN LEVEL - Abnormal; Notable for the following components:   Acetaminophen (Tylenol), Serum <10 (*)    All other components within normal limits  SARS CORONAVIRUS 2 BY RT PCR (HOSPITAL ORDER, PERFORMED IN [facility])  CBC  PROTIME-INR  ETHANOL  URINE DRUG SCREEN,  QUALITATIVE ([facility] ONLY)  URINALYSIS, COMPLETE (UACMP) WITH MICROSCOPIC  HIV ANTIBODY (ROUTINE TESTING W REFLEX)  HEMOGLOBIN A1C  LIPID PANEL  CBC  CREATININE, SERUM  POC SARS CORONAVIRUS 2 AG -  ED   ____________________________________________   EKG  Interpreted by me  Date: 02/29/2020  Rate: 83  Rhythm: normal sinus rhythm  QRS Axis: normal  Intervals: normal  ST/T Wave abnormalities: normal  Conduction Disutrbances: none  Narrative Interpretation: unremarkable      ____________________________________________    RADIOLOGY  CT Code Stroke CTA Head W/WO contrast  Result Date: 02/29/2020 CLINICAL DATA:  Syncopal episode.  Lethargy. EXAM: CT ANGIOGRAPHY HEAD AND NECK CT PERFUSION BRAIN TECHNIQUE: Multidetector CT imaging of the head and neck was performed using the standard protocol during bolus administration of intravenous contrast. Multiplanar CT image reconstructions and MIPs were obtained to evaluate the vascular anatomy. Carotid stenosis measurements (when applicable) are obtained utilizing NASCET criteria, using the distal internal carotid diameter as the  denominator. Multiphase CT imaging of the brain was performed following IV bolus contrast injection. Subsequent parametric perfusion maps were calculated using RAPID software. CONTRAST:  OMNIPAQUE IOHEXOL 350 MG/ML SOLN COMPARISON:  Head CT earlier same day.  MRI 03/21/2017. FINDINGS: CTA NECK FINDINGS Aortic arch: Normal Right carotid system: Normal. No bifurcation disease. Cervical ICA widely patent. Left carotid system: Normal. No bifurcation disease. Cervical ICA widely patent. Vertebral arteries: Both vertebral artery origins widely patent. Both vertebral arteries normal through the cervical region to the foramen magnum. Skeleton: Mild midcervical spondylosis.  No significant finding. Other neck: No mass or lymphadenopathy. Upper chest: Normal Review of the MIP images confirms the above findings CTA HEAD FINDINGS Anterior circulation: Both internal carotid arteries are patent through the skull base and siphon regions. There is some siphon atherosclerotic calcification but no stenosis greater than 30%. The anterior and middle cerebral vessels are patent. No large or medium vessel occlusion. Posterior circulation: Both vertebral arteries are widely patent through the foramen magnum to the basilar. No basilar stenosis. Posterior circulation branch vessels are normal. Venous sinuses: Patent and normal. Anatomic variants: None significant. Review of the MIP images confirms the above findings CT Brain Perfusion Findings: ASPECTS: 10 CBF (<30%) Volume: 0mL Perfusion (Tmax>6.0s) volume: 0mL Mismatch Volume: None Infarction Location:None IMPRESSION: Normal CT angiography of the neck and head, other than ordinary atherosclerotic calcification in the carotid siphon regions but without significant narrowing. No large or medium vessel occlusion. Normal perfusion study. Electronically Signed   By: Paulina Fusi M.D.   On: 02/29/2020 19:38   CT Head Wo Contrast  Addendum Date: 02/29/2020   ADDENDUM REPORT: 02/29/2020  19:23 ADDENDUM: I was subsequently contacted by Dr. Milon Dikes of neurology who informed me that a code stroke was activated for this patient shortly after my interpretation of the non-contrast head CT. There is an ASPECTS of 10 this examination. Electronically Signed   By: Jackey Loge DO   On: 02/29/2020 19:23   Result Date: 02/29/2020 CLINICAL DATA:  Mental status change, persistent or worsening. Additional history provided: Syncopal episode, disorientation EXAM: CT HEAD WITHOUT CONTRAST TECHNIQUE: Contiguous axial images were obtained from the base of the skull through the vertex without intravenous contrast. COMPARISON:  Brain MRI 03/21/2017. Report from head CT 03/22/1999 (images unavailable). FINDINGS: Brain: Cerebral volume is normal for age. Mild ill-defined hypoattenuation within the cerebral white matter is nonspecific, but most often secondary to chronic small vessel ischemia. There is no acute intracranial hemorrhage. No demarcated cortical infarct. No extra-axial  fluid collection. No evidence of intracranial mass. No midline shift. Vascular: No hyperdense vessel.  Atherosclerotic calcifications. Skull: Chronic of the left frontoparietal calvarium, which may be posttraumatic (series 3, image 49). No acute calvarial fracture or focal suspicious osseous lesion. Sinuses/Orbits: Visualized orbits show no acute finding. Paranasal sinus disease at the imaged levels. Most notably, moderate mucosal thickening and a small air-fluid level are present within the right sphenoid sinus. IMPRESSION: No evidence of acute intracranial abnormality. Redemonstrated mild cerebral white matter disease which is nonspecific, but most often related to chronic small vessel ischemia. 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 Correlate with the medical history Paranasal sinus disease, most notably right sphenoid sinusitis. Electronically Signed: By: Jackey LogeKyle  Golden DO On: 02/29/2020 16:23    CT Code Stroke CTA Neck W/WO contrast  Result Date: 02/29/2020 CLINICAL DATA:  Syncopal episode.  Lethargy. EXAM: CT ANGIOGRAPHY HEAD AND NECK CT PERFUSION BRAIN TECHNIQUE: Multidetector CT imaging of the head and neck was performed using the standard protocol during bolus administration of intravenous contrast. Multiplanar CT image reconstructions and MIPs were obtained to evaluate the vascular anatomy. Carotid stenosis measurements (when applicable) are obtained utilizing NASCET criteria, using the distal internal carotid diameter as the denominator. Multiphase CT imaging of the brain was performed following IV bolus contrast injection. Subsequent parametric perfusion maps were calculated using RAPID software. CONTRAST:  100mL OMNIPAQUE IOHEXOL 350 MG/ML SOLN COMPARISON:  Head CT earlier same day.  MRI 03/21/2017. FINDINGS: CTA NECK FINDINGS Aortic arch: Normal Right carotid system: Normal. No bifurcation disease. Cervical ICA widely patent. Left carotid system: Normal. No bifurcation disease. Cervical ICA widely patent. Vertebral arteries: Both vertebral artery origins widely patent. Both vertebral arteries normal through the cervical region to the foramen magnum. Skeleton: Mild midcervical spondylosis.  No significant finding. Other neck: No mass or lymphadenopathy. Upper chest: Normal Review of the MIP images confirms the above findings CTA HEAD FINDINGS Anterior circulation: Both internal carotid arteries are patent through the skull base and siphon regions. There is some siphon atherosclerotic calcification but no stenosis greater than 30%. The anterior and middle cerebral vessels are patent. No large or medium vessel occlusion. Posterior circulation: Both vertebral arteries are widely patent through the foramen magnum to the basilar. No basilar stenosis. Posterior circulation branch vessels are normal. Venous sinuses: Patent and normal. Anatomic variants: None significant. Review of the MIP images  confirms the above findings CT Brain Perfusion Findings: ASPECTS: 10 CBF (<30%) Volume: 0mL Perfusion (Tmax>6.0s) volume: 0mL Mismatch Volume: None Infarction Location:None IMPRESSION: Normal CT angiography of the neck and head, other than ordinary atherosclerotic calcification in the carotid siphon regions but without significant narrowing. No large or medium vessel occlusion. Normal perfusion study. Electronically Signed   By: Paulina FusiMark  Shogry M.D.   On: 02/29/2020 19:38   CT Code Stroke Cerebral Perfusion with contrast  Result Date: 02/29/2020 CLINICAL DATA:  Syncopal episode.  Lethargy. EXAM: CT ANGIOGRAPHY HEAD AND NECK CT PERFUSION BRAIN TECHNIQUE: Multidetector CT imaging of the head and neck was performed using the standard protocol during bolus administration of intravenous contrast. Multiplanar CT image reconstructions and MIPs were obtained to evaluate the vascular anatomy. Carotid stenosis measurements (when applicable) are obtained utilizing NASCET criteria, using the distal internal carotid diameter as the denominator. Multiphase CT imaging of the brain was performed following IV bolus contrast injection. Subsequent parametric perfusion maps were calculated using RAPID software. CONTRAST:  100mL OMNIPAQUE IOHEXOL 350 MG/ML SOLN COMPARISON:  Head CT earlier same day.  MRI  03/21/2017. FINDINGS: CTA NECK FINDINGS Aortic arch: Normal Right carotid system: Normal. No bifurcation disease. Cervical ICA widely patent. Left carotid system: Normal. No bifurcation disease. Cervical ICA widely patent. Vertebral arteries: Both vertebral artery origins widely patent. Both vertebral arteries normal through the cervical region to the foramen magnum. Skeleton: Mild midcervical spondylosis.  No significant finding. Other neck: No mass or lymphadenopathy. Upper chest: Normal Review of the MIP images confirms the above findings CTA HEAD FINDINGS Anterior circulation: Both internal carotid arteries are patent through the  skull base and siphon regions. There is some siphon atherosclerotic calcification but no stenosis greater than 30%. The anterior and middle cerebral vessels are patent. No large or medium vessel occlusion. Posterior circulation: Both vertebral arteries are widely patent through the foramen magnum to the basilar. No basilar stenosis. Posterior circulation branch vessels are normal. Venous sinuses: Patent and normal. Anatomic variants: None significant. Review of the MIP images confirms the above findings CT Brain Perfusion Findings: ASPECTS: 10 CBF (<30%) Volume: 0mL Perfusion (Tmax>6.0s) volume: 0mL Mismatch Volume: None Infarction Location:None IMPRESSION: Normal CT angiography of the neck and head, other than ordinary atherosclerotic calcification in the carotid siphon regions but without significant narrowing. No large or medium vessel occlusion. Normal perfusion study. Electronically Signed   By: Paulina FusiMark  Shogry M.D.   On: 02/29/2020 19:38   DG Chest Portable 1 View  Result Date: 02/29/2020 CLINICAL DATA:  Cough EXAM: PORTABLE CHEST 1 VIEW COMPARISON:  09/05/2012 FINDINGS: The heart size and mediastinal contours are within normal limits. Both lungs are clear. The visualized skeletal structures are unremarkable. IMPRESSION: Negative. Electronically Signed   By: Charlett NoseKevin  Dover M.D.   On: 02/29/2020 19:52    ____________________________________________   PROCEDURES .Critical Care Performed by: Sharman CheekStafford, Jezabel Lecker, MD Authorized by: Sharman CheekStafford, Jakalyn Kratky, MD   Critical care provider statement:    Critical care time (minutes):  35   Critical care time was exclusive of:  Separately billable procedures and treating other patients   Critical care was necessary to treat or prevent imminent or life-threatening deterioration of the following conditions:  CNS failure or compromise   Critical care was time spent personally by me on the following activities:  Development of treatment plan with patient or surrogate,  discussions with consultants, evaluation of patient's response to treatment, examination of patient, obtaining history from patient or surrogate, ordering and performing treatments and interventions, ordering and review of laboratory studies, ordering and review of radiographic studies, pulse oximetry, re-evaluation of patient's condition, review of old charts and blood draw for specimens Comments:        Angiocath insertion  Date/Time: 02/29/2020 8:50 PM Performed by: Sharman CheekStafford, Adaiah Jaskot, MD Authorized by: Sharman CheekStafford, Jasmane Brockway, MD  Consent: The procedure was performed in an emergent situation. Patient identity confirmed: arm band Local anesthesia used: no  Anesthesia: Local anesthesia used: no  Sedation: Patient sedated: no  Patient tolerance: patient tolerated the procedure well with no immediate complications Comments: US visualization, 18g, L AC, 1 attempt, EBL0, no complications     ____________________________________________  DIFFERENTIAL DIAGNOSIS   Intracranial hemorrhage, ischemic stroke/LVO, intoxication, medication side effect, electrolyte abnormality, viral illness, pneumonia  CLINICAL IMPRESSION / ASSESSMENT AND PLAN / ED COURSE  Medications ordered in the ED: Medications   stroke: mapping our early stages of recovery book (has no administration in time range)  0.9 %  sodium chloride infusion (has no administration in time range)  acetaminophen (TYLENOL) tablet 650 mg (has no administration in time range)    Or  acetaminophen (TYLENOL) 160 MG/5ML solution 650 mg (has no administration in time range)    Or  acetaminophen (TYLENOL) suppository 650 mg (has no administration in time range)  enoxaparin (LOVENOX) injection 40 mg (has no administration in time range)  SUMAtriptan (IMITREX) tablet 50-100 mg (has no administration in time range)  aspirin EC tablet 81 mg (has no administration in time range)  atorvastatin (LIPITOR) tablet 40 mg (has no administration in  time range)  gadobutrol (GADAVIST) 1 MMOL/ML injection 7 mL (has no administration in time range)  lactated ringers bolus 1,000 mL (1,000 mLs Intravenous New Bag/Given 02/29/20 1903)  iohexol (OMNIPAQUE) 350 MG/ML injection 100 mL (100 mLs Intravenous Contrast Given 02/29/20 1915)  aspirin suppository 300 mg (300 mg Rectal Given 02/29/20 2018)    Pertinent labs & imaging results that were available during my care of the patient were reviewed by me and considered in my medical decision making (see chart for details).   OMESHA BOWERMAN was evaluated in Emergency Department on 02/29/2020 for the symptoms described in the history of present illness. She was evaluated in the context of the global COVID-19 pandemic, which necessitated consideration that the patient might be at risk for infection with the SARS-CoV-2 virus that causes COVID-19. Institutional protocols and algorithms that pertain to the evaluation of patients at risk for COVID-19 are in a state of rapid change based on information released by regulatory bodies including the CDC and federal and state organizations. These policies and algorithms were followed during the patient's care in the ED.     Clinical Course as of 02/29/20 2050  Mon Feb 29, 2020  0762 Patient presents with altered mental status, found to have left medial rectus palsy, left APD, aphasia, concerning for LVO stroke.  Stat neurology consult obtained, Dr. Laurence Slate plans to proceed with CTA/CTP.  I placed an 18-gauge IV in the left [facility]. [PS]    Clinical Course User Index [PS] Sharman Cheek, MD    ----------------------------------------- 8:49 PM on 02/29/2020 -----------------------------------------  CTA/CTP negative for LVO.  Case discussed with Dr. Wilford Corner who recommends hospitalization, MRI with and without contrast.  Case discussed with hospitalist.  Will give aspirin suppository due to dysarthria.   ____________________________________________   FINAL CLINICAL  IMPRESSION(S) / ED DIAGNOSES    Final diagnoses:  Confusion  Dysarthria     ED Discharge Orders    None      Portions of this note were generated with dragon dictation software. Dictation errors may occur despite best attempts at proofreading.   Sharman Cheek, MD 02/29/20 2051

## 2020-02-29 NOTE — ED Notes (Signed)
Patient doing tele assessment at this time.

## 2020-03-01 ENCOUNTER — Encounter
Admission: EM | Disposition: A | Discharge status: Home / Self Care | Payer: Self-pay | Source: Home / Self Care | Attending: Internal Medicine

## 2020-03-01 ENCOUNTER — Observation Stay
Admit: 2020-03-01 | Discharge: 2020-03-01 | Disposition: A | Discharge status: Home / Self Care | Payer: BC Managed Care – PPO | Attending: Cardiology | Admitting: Cardiology

## 2020-03-01 ENCOUNTER — Observation Stay: Payer: BC Managed Care – PPO | Admitting: Certified Registered Nurse Anesthetist

## 2020-03-01 ENCOUNTER — Encounter: Payer: Self-pay | Admitting: Internal Medicine

## 2020-03-01 ENCOUNTER — Observation Stay
Admit: 2020-03-01 | Discharge: 2020-03-01 | Disposition: A | Discharge status: Home / Self Care | Payer: BC Managed Care – PPO | Attending: Internal Medicine | Admitting: Internal Medicine

## 2020-03-01 DIAGNOSIS — R29818 Other symptoms and signs involving the nervous system: Secondary | ICD-10-CM | POA: Diagnosis not present

## 2020-03-01 DIAGNOSIS — R410 Disorientation, unspecified: Principal | ICD-10-CM

## 2020-03-01 DIAGNOSIS — I639 Cerebral infarction, unspecified: Secondary | ICD-10-CM | POA: Diagnosis not present

## 2020-03-01 DIAGNOSIS — R471 Dysarthria and anarthria: Secondary | ICD-10-CM

## 2020-03-01 DIAGNOSIS — G43009 Migraine without aura, not intractable, without status migrainosus: Secondary | ICD-10-CM

## 2020-03-01 HISTORY — PX: TEE WITHOUT CARDIOVERSION: SHX5443

## 2020-03-01 LAB — URINE DRUG SCREEN, QUALITATIVE (ARMC ONLY)
Amphetamines, Ur Screen: NOT DETECTED
Barbiturates, Ur Screen: NOT DETECTED
Benzodiazepine, Ur Scrn: NOT DETECTED
Cannabinoid 50 Ng, Ur ~~LOC~~: NOT DETECTED
Cocaine Metabolite,Ur ~~LOC~~: NOT DETECTED
MDMA (Ecstasy)Ur Screen: NOT DETECTED
Methadone Scn, Ur: NOT DETECTED
Opiate, Ur Screen: POSITIVE — AB
Phencyclidine (PCP) Ur S: NOT DETECTED
Tricyclic, Ur Screen: POSITIVE — AB

## 2020-03-01 LAB — URINALYSIS, COMPLETE (UACMP) WITH MICROSCOPIC
Bacteria, UA: NONE SEEN
Bilirubin Urine: NEGATIVE
Glucose, UA: NEGATIVE mg/dL
Hgb urine dipstick: NEGATIVE
Ketones, ur: 5 mg/dL — AB
Nitrite: NEGATIVE
Protein, ur: NEGATIVE mg/dL
Specific Gravity, Urine: >1.046 — ABNORMAL HIGH (ref 1.005–1.030)
pH: 5 (ref 5.0–8.0)

## 2020-03-01 LAB — ECHOCARDIOGRAM COMPLETE
AR max vel: 2.09 cm2
AV Area VTI: 2.01 cm2
AV Area mean vel: 2.04 cm2
AV Mean grad: 3 mmHg
AV Peak grad: 5.9 mmHg
Ao pk vel: 1.21 m/s
Area-P 1/2: 3.4 cm2
Calc EF: 64.7 %
Height: 68 in
MV VTI: 1.95 cm2
S' Lateral: 3.6 cm
Single Plane A2C EF: 60.8 %
Single Plane A4C EF: 66.5 %
Weight: 2640 oz

## 2020-03-01 LAB — ANTITHROMBIN III: AntiThromb III Func: 108 % (ref 75–120)

## 2020-03-01 LAB — LIPID PANEL
Cholesterol: 177 mg/dL (ref 0–200)
HDL: 65 mg/dL (ref >40)
LDL Cholesterol: 89 mg/dL (ref 0–99)
Total CHOL/HDL Ratio: 2.7 RATIO
Triglycerides: 114 mg/dL (ref <150)
VLDL: 23 mg/dL (ref 0–40)

## 2020-03-01 LAB — HEMOGLOBIN A1C
Hgb A1c MFr Bld: 5.5 % (ref 4.8–5.6)
Mean Plasma Glucose: 111.15 mg/dL

## 2020-03-01 LAB — HIV ANTIBODY (ROUTINE TESTING W REFLEX): HIV Screen 4th Generation wRfx: NONREACTIVE

## 2020-03-01 SURGERY — ECHOCARDIOGRAM, TRANSESOPHAGEAL
Anesthesia: General

## 2020-03-01 MED ORDER — ASPIRIN 81 MG PO TBEC: 81.0000 mg | DELAYED_RELEASE_TABLET | Freq: Every day | ORAL | 11 refills | Status: AC

## 2020-03-01 MED ORDER — CLOPIDOGREL BISULFATE 75 MG PO TABS: 75.0000 mg | ORAL_TABLET | Freq: Every day | ORAL | Status: DC

## 2020-03-01 MED ORDER — PROPOFOL 500 MG/50ML IV EMUL
INTRAVENOUS | Status: DC | PRN
  Administered 2020-03-01 (×5): 50 ug via INTRAVENOUS

## 2020-03-01 MED ORDER — SODIUM CHLORIDE 0.9 % IV SOLN: INTRAVENOUS | Status: DC

## 2020-03-01 MED ORDER — LIDOCAINE VISCOUS HCL 2 % MT SOLN
OROMUCOSAL | Status: AC
  Filled 2020-03-01: qty 15

## 2020-03-01 MED ORDER — SODIUM CHLORIDE FLUSH 0.9 % IV SOLN
INTRAVENOUS | Status: AC
  Filled 2020-03-01: qty 10

## 2020-03-01 MED ORDER — ATORVASTATIN CALCIUM 40 MG PO TABS: 40.0000 mg | ORAL_TABLET | Freq: Every day | ORAL | 1 refills | Status: AC

## 2020-03-01 MED ORDER — FENTANYL CITRATE (PF) 100 MCG/2ML IJ SOLN
INTRAMUSCULAR | Status: AC
  Filled 2020-03-01: qty 2

## 2020-03-01 MED ORDER — CLOPIDOGREL BISULFATE 75 MG PO TABS: 75.0000 mg | ORAL_TABLET | Freq: Every day | ORAL | 0 refills | Status: AC

## 2020-03-01 MED ORDER — BUTAMBEN-TETRACAINE-BENZOCAINE 2-2-14 % EX AERO
INHALATION_SPRAY | CUTANEOUS | Status: AC
  Filled 2020-03-01: qty 5

## 2020-03-01 MED ORDER — MIDAZOLAM HCL 5 MG/5ML IJ SOLN
INTRAMUSCULAR | Status: AC
  Filled 2020-03-01: qty 5

## 2020-03-01 NOTE — ED Notes (Signed)
Pt reminded she's got a purewick in place, attempting for urine sample.

## 2020-03-01 NOTE — Discharge Instructions (Signed)
Moderate Conscious Sedation, Adult, Care After This sheet gives you information about how to care for yourself after your procedure. Your health care provider may also give you more specific instructions. If you have problems or questions, contact your health care provider. What can I expect after the procedure? After the procedure, it is common to have:  Sleepiness for several hours.  Impaired judgment for several hours.  Difficulty with balance.  Vomiting if you eat too soon. Follow these instructions at home: For the time period you were told by your health care provider:  Rest.  Do not participate in activities where you could fall or become injured.  Do not drive or use machinery.  Do not drink alcohol.  Do not take sleeping pills or medicines that cause drowsiness.  Do not make important decisions or sign legal documents.  Do not take care of children on your own.      Eating and drinking  Follow the diet recommended by your health care provider.  Drink enough fluid to keep your urine pale yellow.  If you vomit: ? Drink water, juice, or soup when you can drink without vomiting. ? Make sure you have little or no nausea before eating solid foods.   General instructions  Take over-the-counter and prescription medicines only as told by your health care provider.  Have a responsible adult stay with you for the time you are told. It is important to have someone help care for you until you are awake and alert.  Do not smoke.  Keep all follow-up visits as told by your health care provider. This is important. Contact a health care provider if:  You are still sleepy or having trouble with balance after 24 hours.  You feel light-headed.  You keep feeling nauseous or you keep vomiting.  You develop a rash.  You have a fever.  You have redness or swelling around the IV site. Get help right away if:  You have trouble breathing.  You have new-onset confusion at  home. Summary  After the procedure, it is common to feel sleepy, have impaired judgment, or feel nauseous if you eat too soon.  Rest after you get home. Know the things you should not do after the procedure.  Follow the diet recommended by your health care provider and drink enough fluid to keep your urine pale yellow.  Get help right away if you have trouble breathing or new-onset confusion at home. This information is not intended to replace advice given to you by your health care provider. Make sure you discuss any questions you have with your health care provider. Document Revised: 05/22/2019 Document Reviewed: 12/18/2018 Elsevier Patient Education  2021 [employer]  

## 2020-03-01 NOTE — Evaluation (Signed)
Speech Language Pathology Evaluation Patient Details Name: Pamela Clayton MRN: [id number] DOB: 02/02/1966 Today's Date: 03/01/2020 Time: 0930-1030 SLP Time Calculation (min) (ACUTE ONLY): 60 min  Problem List:  Patient Active Problem List   Diagnosis Date Noted  . History of Spell of altered cognition in 2019 02/29/2020  . HTN (hypertension) 02/29/2020  . Acute focal neurological deficit, onset within 3-24 hours 02/29/2020  . Vitamin B12 deficiency 02/29/2020  . Acute focal neurological deficit 02/29/2020  . Acute metabolic encephalopathy 02/29/2020  . Syncope 02/29/2020  . Bilateral hand numbness 05/03/2017  . Migraine without aura and without status migrainosus, not intractable 12/16/2015   Past Medical History:  Past Medical History:  Diagnosis Date  . Hypertension    Past Surgical History: History reviewed. No pertinent surgical history. HPI:  Pt is a 55 y.o. 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 Patient's last known normal was at 6 AM on the day of arrival when the husband left for work however patient called him at 2 PM and he noted that she had slurring of the speech, was sounding confused and speaking in nonsensical sentences and being repetitive. EMS reports that patient syncopized with standing while they were there on the scene and she had positive orthostatics. Husband also reports that patient had apparently taken cough and cold medication with codeine.  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 This may indicate a small  area of acute ischemia. No hemorrhage or mass effect.  2. Minimal white matter hyperintensity which may be seen  in the  context of migraine headaches".   Assessment / Plan / Recommendation Clinical Impression  Pt presented to the hospital w/ acute slurring of speech and difficulty speaking last pm. See MRI report. Pt is tired, fatigued during this assessment as she stated she did not sleep well last night. Pt is verbal, pleasant; Husband present. Both pt and Husband reported pt's speech was "improved" and at her Baseline. Pt was administered the Western Aphasia Battery - bedside screening assessment.  She appears to present w/ No overt Expressive or Receptive language deficits; No Aphasia. Pt completed all language tasks including spontaneous speech/Fluency tasks w/ adequate presentation and accuracy. Pt was Oriented x4. She followed Sequential Commands and Repetition tasks, then Auditory Comprehension tasks. During verbal description and organization tasks, she completed tasks appropriately. Tasks of likes/differences were adequate as well. No Paraphasias noted during expressive language tasks, however, pt did appear fatigured/tired -- she stated she did not sleepy much since admitting to the ED yesterday PM. (Monitor constantly alarming during this assessment) Writing of common information were appropriate as was object Naming and description of Function. Pt conversed in general conversation re: self w/ no apparent deficits noted(min low volume - pt stated she was a "quiet person" baseline). Pt's speech articulation was fully intelligible but lacked min effort and crisp articulation -- unsure if related to fatigue. No other Motor Speech deficits noted.  Recommend no immediate f/u post discharge, however, if full umph of articulation of speech does not return by the time pt f/u w/ her PCP post D/C home, then recommend f/u w/ Outpatient ST services to address speech in ADLs. Handouts on articulatory strategies,  breath support strategies given in order to improve communication in ADLs. Husband and pt agreed.    SLP  Assessment  SLP Recommendation/Assessment: Patient does not need any further Speech Lanaguage Pathology Services (at this time) SLP Visit Diagnosis: Dysarthria and anarthria (R47.1)    Follow Up Recommendations  None (at this time)    Frequency and Duration  (n/a)   (n/a)      SLP Evaluation Cognition  Overall Cognitive Status: Within Functional Limits for tasks assessed Arousal/Alertness: Awake/alert Orientation Level: Oriented to person;Oriented to place;Oriented to situation Attention: Focused;Sustained Focused Attention: Appears intact Sustained Attention: Appears intact Awareness: Appears intact Problem Solving: Appears intact Executive Function: Reasoning;Sequencing Reasoning: Appears intact Sequencing: Appears intact Behaviors:  (n/a) Safety/Judgment: Appears intact       Comprehension  Auditory Comprehension Overall Auditory Comprehension: Appears within functional limits for tasks assessed Yes/No Questions: Within Functional Limits Commands: Within Functional Limits Conversation: Simple Other Conversation Comments: tired IT trainer Discrimination: Not tested Reading Comprehension Reading Status: Not tested    Expression Expression Primary Mode of Expression: Verbal Verbal Expression Overall Verbal Expression: Appears within functional limits for tasks assessed Initiation: No impairment Automatic Speech: Name;Social Response;Counting;Day of week;Month of year Level of Generative/Spontaneous Verbalization: Conversation Repetition: No impairment Naming: No impairment Pragmatics: No impairment Interfering Components:  (tired speech - appeared slight dysarthria) Effective Techniques: Articulatory cues Non-Verbal Means of Communication: Not applicable Written Expression Dominant Hand: Right Written Expression: Within Functional Limits   Oral / Motor  Oral Motor/Sensory Function Overall Oral Motor/Sensory Function: Within functional  limits Motor Speech Overall Motor Speech: Appears within functional limits for tasks assessed (grossly) Respiration: Within functional limits Phonation: Low vocal intensity (says she's a "quiet person") Resonance: Within functional limits Articulation: Within functional limitis (grossly) Intelligibility: Intelligible Motor Planning: Witnin functional limits Motor Speech Errors: Not applicable Effective Techniques: Increased vocal intensity;Over-articulate   GO                      Jerilynn Som, MS, CCC-SLP Speech Language Pathologist [facility] [phone] [facility] 03/01/2020, 11:47 AM

## 2020-03-01 NOTE — Evaluation (Signed)
Physical Therapy Evaluation Patient Details Name: Pamela Clayton MRN: [id number] DOB: [birth year] Today's Date: 03/01/2020   History of Present Illness  Pt is a 55 y.o. female presenting to hospital 1/24 with AMS and syncope at home; pt with difficulty speaking, slurring words, speaking non-sensical sentences and being repetitive; syncope and orthostatic with EMS; nonproductive cough and congestion past 2-3 days.  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  PMH includes migraines, HA's, htn, B carpal tunnel syndrome.  Clinical Impression  Prior to hospital admission, pt was independent; lives with her husband in 1 level home.  Currently pt is independent with bed mobility; SBA with transfers; and CGA to SBA with ambulation up to 180 feet (no AD).  Pt with a couple altered steps to the side initially when walking and also 1x when in bathroom but pt able to self correct (pt was sleeping upon PT arrival--pt appeared tired during session and anticipate still recovering from anesthesia).  Pt's and pt's husband educated on pacing, fall prevention/safety, and gradually increasing activity each day; also educated pt and pt's husband on mobility safety since pt still recovering after procedure today: both verbalizing appropriate understanding.  Pt would benefit from skilled PT to address noted impairments and functional limitations (see below for any additional details).  Upon hospital discharge, no further PT needs anticipated.    Follow Up Recommendations No PT follow up    Equipment Recommendations  None recommended by PT    Recommendations for Other Services       Precautions / Restrictions Precautions Precaution Comments: Seizure precautions Restrictions Weight Bearing Restrictions: No      Mobility  Bed Mobility Overal bed mobility: Independent                   Transfers Overall transfer level: Needs assistance Equipment used: None Transfers: Sit to/from Stand Sit to Stand: Supervision         General transfer comment: SBA for lines  Ambulation/Gait Ambulation/Gait assistance: Min guard;Supervision Gait Distance (Feet):  (60 feet; 180 feet; 40 feet) Assistive device: None Gait Pattern/deviations: Step-through pattern Gait velocity: mildly decreased   General Gait Details: pt with a couple altered steps during ambulation (to the side) but pt able to self correct on own safely  Stairs            Wheelchair Mobility    Modified Rankin (Stroke Patients Only)       Balance Overall balance assessment: Needs assistance Sitting-balance support: No upper extremity supported;Feet supported Sitting balance-Leahy Scale: Normal Sitting balance - Comments: steady sitting reaching outside BOS   Standing balance support: No upper extremity supported;During functional activity Standing balance-Leahy Scale: Good Standing balance comment: pt with a couple altered steps to the side while walking in bathroom but pt able to self correct                             Pertinent Vitals/Pain Pain Assessment: No/denies pain  Vitals (HR and O2 on room air) stable and WFL throughout treatment session.    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; no recent falls     Hand Dominance   Dominant Hand: Right    Extremity/Trunk Assessment   Upper Extremity Assessment Upper Extremity Assessment: Defer to OT  evaluation LUE Deficits / Details: Per OT evaluation "Mild LUE strength deficit and decreased speed of coordination - completes all ADLs with increased time"    Lower Extremity Assessment Lower Extremity Assessment:  (Intact B LE strength, sensation (light touch), proprioception, heel to shin coordination, and tone.)    Cervical / Trunk Assessment Cervical / Trunk Assessment: Normal  Communication   Communication: Expressive difficulties (occasional word finding difficulty)  Cognition Arousal/Alertness:  (pt appearing tired post TEE (nurse reporting pt received propofol for procedure)) Behavior During Therapy: WFL for tasks assessed/performed Overall Cognitive Status: Within Functional Limits for tasks assessed                                        General Comments   MD contacted therapist and requesting physical therapy see pt after procedure today.  Nursing cleared pt for participation in physical therapy.  Pt agreeable to PT session.    Exercises     Assessment/Plan    PT Assessment Patient needs continued PT services  PT Problem List Decreased balance;Decreased mobility       PT Treatment Interventions DME instruction;Gait training;Stair training;Functional mobility training;Therapeutic activities;Therapeutic exercise;Balance training;Patient/family education    PT Goals (Current goals can be found in the Care Plan section)  Acute Rehab PT Goals Patient Stated Goal: To return home PT Goal Formulation: With patient Time For Goal Achievement: 03/15/20 Potential to Achieve Goals: Good    Frequency Min 2X/week   Barriers to discharge        Co-evaluation               AM-PAC PT "6 Clicks" Mobility  Outcome Measure Help needed turning from your back to your side while in a flat bed without using bedrails?: None Help needed moving from lying on your back to sitting on the side of a flat bed without using bedrails?: None Help needed moving to  and from a bed to a chair (including a wheelchair)?: A Little Help needed standing up from a chair using your arms (e.g., wheelchair or bedside chair)?: A Little Help needed to walk in hospital room?: A Little Help needed climbing 3-5 steps with a railing? : A Little 6 Click Score: 20    End of Session Equipment Utilized During Treatment: Gait belt Activity Tolerance: Patient tolerated treatment well Patient left: in bed;with call bell/phone within reach;with family/visitor present (stretcher bed in lowest height with B rails up) Nurse Communication: Mobility status;Precautions PT Visit Diagnosis: Other abnormalities of gait and mobility (R26.89)    Time: 5188-4166 PT Time Calculation (min) (ACUTE ONLY): 30 min   Charges:   PT Evaluation $PT Eval Low Complexity: 1 Low PT Treatments $Therapeutic Activity: 8-22 mins       Hendricks Limes, PT 03/01/20, 4:47 PM

## 2020-03-01 NOTE — Consult Note (Signed)
Cardiology Consultation Note    Patient ID: Pamela Clayton S [place]elveen, MRN: [id number], DOB/AGE: 1965-06-23 55 y.o. Admit date: 02/29/2020   Date of Consult: 03/01/2020 Primary Physician: Titus MouldWhite, Elizabeth Burney, NP Primary Cardiologist: none  Chief Complaint: stroke Reason for Consultation: for tee Requesting MD: Dr. Nelson ChimesAmin  HPI: Pamela Clayton S Matuszak is a 55 y.o. female with history of hypertension, depression, chronic hand numbness who had a spell of altered mental cognition in 2019 where an MRI showed white matter disease.  She was brought to the ER yesterday with complaints of confusion and possible syncope.  She apparently was normal at 6 AM however at 2 PM she was noted to have slurred speech and was somewhat confused.  EMS states that she had a syncopal episode while standing while they were on the scene and she had positive orthostatics.  She had also taken a cold and cough medication containing codeine.  She had low-grade temp of 99.1 with adequate blood pressure on arrival to emergency room.  Electrocardiogram was normal.  She had a pulse of 75 100% of oxygen on room air.  She had a negative head CT.  She was Covid negative.  She had negative ethanol salicylate Tates and acetaminophen.  Urine drug screen is pending.  She has no drug history.  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  There was no hemorrhage or mass-effect.  Echocardiogram was ordered showing preserved LV function with no abnormalities.  TEE is requested.  Past Medical History:  Diagnosis Date  . Hypertension       Surgical History: History reviewed. No pertinent surgical history.   Home Meds: Prior to Admission medications   Medication Sig Start Date End Date Taking? Authorizing Provider  Guaifenesin (MUCINEX MAXIMUM STRENGTH) 1200 MG TB12 Take 1,200 mg by mouth every 12 (twelve) hours as needed (congestion).   Yes [provider]  losartan (COZAAR) 100 MG tablet  Take 100 mg by mouth daily. 01/25/20 01/24/21 Yes [provider]  Multiple Vitamins-Minerals (MULTIVITAMIN GUMMIES WOMENS) CHEW Chew 1 tablet by mouth daily.   Yes [provider]  nortriptyline (PAMELOR) 10 MG capsule Take 20 mg by mouth at bedtime. 02/09/20  Yes [provider]  SUMAtriptan (IMITREX) 100 MG tablet Take 50-100 mg by mouth daily as needed for migraine. May repeat in 2 hours if headache persists or recurs.   Yes [provider]  topiramate (TOPAMAX) 50 MG tablet Take 50 mg by mouth 2 (two) times daily. 02/09/20  Yes [provider]  venlafaxine XR (EFFEXOR-XR) 150 MG 24 hr capsule Take 150 mg by mouth daily. 01/26/20  Yes [provider]  vitamin B-12 (CYANOCOBALAMIN) 1000 MCG tablet Take 1,000 mcg by mouth daily.   Yes [provider]  vitamin C (ASCORBIC ACID) 500 MG tablet Take 500 mg by mouth daily.   Yes [provider]    Inpatient Medications:  .  stroke: mapping our early stages of recovery book   Does not apply Once  . aspirin EC  81 mg Oral Daily  . atorvastatin  40 mg Oral Daily  . enoxaparin (LOVENOX) injection  40 mg Subcutaneous Q24H   . sodium chloride 75 mL/hr at 02/29/20 2212    Allergies: Not on File  Social History   Socioeconomic History  . Marital status: Married    Spouse name: Not on file  . Number of children: Not on file  . Years of education: Not on  file  . Highest education level: Not on file  Occupational History  . Not on file  Tobacco Use  . Smoking status: Never Smoker  . Smokeless tobacco: Never Used  Substance and Sexual Activity  . Alcohol use: Yes    Comment: occ  . Drug use: Never  . Sexual activity: Not on file  Other Topics Concern  . Not on file  Social History Narrative  . Not on file   Social Determinants of Health   Financial Resource Strain: Not on file  Food Insecurity: Not on file  Transportation Needs: Not on file  Physical Activity: Not  on file  Stress: Not on file  Social Connections: Not on file  Intimate Partner Violence: Not on file     Family History  Problem Relation Age of Onset  . Breast cancer Mother 56  . Breast cancer Paternal Grandmother      Review of Systems: A 12-system review of systems was performed and is negative except as noted in the HPI.  Labs: No results for input(s): CKTOTAL, CKMB, TROPONINI in the last 72 hours. Lab Results  Component Value Date   WBC 4.4 02/29/2020   HGB 13.0 02/29/2020   HCT 38.6 02/29/2020   MCV 91.0 02/29/2020   PLT 160 02/29/2020    Recent Labs  Lab 02/29/20 1550 02/29/20 1901  NA 141  --   K 4.1  --   CL 105  --   CO2 26  --   BUN 14  --   CREATININE 0.80 0.91  CALCIUM 9.5  --   PROT 7.4  --   BILITOT 0.7  --   ALKPHOS 84  --   ALT 39  --   AST 38  --   GLUCOSE 102*  --    Lab Results  Component Value Date   CHOL 177 03/01/2020   HDL 65 03/01/2020   LDLCALC 89 03/01/2020   TRIG 114 03/01/2020   No results found for: DDIMER  Radiology/Studies:  CT Code Stroke CTA Head W/WO contrast  Result Date: 02/29/2020 CLINICAL DATA:  Syncopal episode.  Lethargy. EXAM: CT ANGIOGRAPHY HEAD AND NECK CT PERFUSION BRAIN TECHNIQUE: Multidetector CT imaging of the head and neck was performed using the standard protocol during bolus administration of intravenous contrast. Multiplanar CT image reconstructions and MIPs were obtained to evaluate the vascular anatomy. Carotid stenosis measurements (when applicable) are obtained utilizing NASCET criteria, using the distal internal carotid diameter as the denominator. Multiphase CT imaging of the brain was performed following IV bolus contrast injection. Subsequent parametric perfusion maps were calculated using RAPID software. CONTRAST:  OMNIPAQUE IOHEXOL 350 MG/ML SOLN COMPARISON:  Head CT earlier same day.  MRI 03/21/2017. FINDINGS: CTA NECK FINDINGS Aortic arch: Normal Right carotid system: Normal. No bifurcation  disease. Cervical ICA widely patent. Left carotid system: Normal. No bifurcation disease. Cervical ICA widely patent. Vertebral arteries: Both vertebral artery origins widely patent. Both vertebral arteries normal through the cervical region to the foramen magnum. Skeleton: Mild midcervical spondylosis.  No significant finding. Other neck: No mass or lymphadenopathy. Upper chest: Normal Review of the MIP images confirms the above findings CTA HEAD FINDINGS Anterior circulation: Both internal carotid arteries are patent through the skull base and siphon regions. There is some siphon atherosclerotic calcification but no stenosis greater than 30%. The anterior and middle cerebral vessels are patent. No large or medium vessel occlusion. Posterior circulation: Both vertebral arteries are widely patent through the foramen magnum to the  basilar. No basilar stenosis. Posterior circulation branch vessels are normal. Venous sinuses: Patent and normal. Anatomic variants: None significant. Review of the MIP images confirms the above findings CT Brain Perfusion Findings: ASPECTS: 10 CBF (<30%) Volume: 72mL Perfusion (Tmax>6.0s) volume: 56mL Mismatch Volume: None Infarction Location:None IMPRESSION: Normal CT angiography of the neck and head, other than ordinary atherosclerotic calcification in the carotid siphon regions but without significant narrowing. No large or medium vessel occlusion. Normal perfusion study. Electronically Signed   By: Paulina Fusi M.D.   On: 02/29/2020 19:38   CT Head Wo Contrast  Addendum Date: 02/29/2020   ADDENDUM REPORT: 02/29/2020 19:23 ADDENDUM: I was subsequently contacted by Dr. Milon Dikes of neurology who informed me that a code stroke was activated for this patient shortly after my interpretation of the non-contrast head CT. There is an ASPECTS of 10 this examination. Electronically Signed   By: Jackey Loge DO   On: 02/29/2020 19:23   Result Date: 02/29/2020 CLINICAL DATA:  Mental status  change, persistent or worsening. Additional history provided: Syncopal episode, disorientation EXAM: CT HEAD WITHOUT CONTRAST TECHNIQUE: Contiguous axial images were obtained from the base of the skull through the vertex without intravenous contrast. COMPARISON:  Brain MRI 03/21/2017. Report from head CT 03/22/1999 (images unavailable). FINDINGS: Brain: Cerebral volume is normal for age. Mild ill-defined hypoattenuation within the cerebral white matter is nonspecific, but most often secondary to chronic small vessel ischemia. There is no acute intracranial hemorrhage. No demarcated cortical infarct. No extra-axial fluid collection. No evidence of intracranial mass. No midline shift. Vascular: No hyperdense vessel.  Atherosclerotic calcifications. Skull: Chronic of the left frontoparietal calvarium, which may be posttraumatic (series 3, image 49). No acute calvarial fracture or focal suspicious osseous lesion. Sinuses/Orbits: Visualized orbits show no acute finding. Paranasal sinus disease at the imaged levels. Most notably, moderate mucosal thickening and a small air-fluid level are present within the right sphenoid sinus. IMPRESSION: No evidence of acute intracranial abnormality. Redemonstrated mild cerebral white matter disease which is nonspecific, but most often related to chronic small vessel ischemia. 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 Correlate with the medical history Paranasal sinus disease, most notably right sphenoid sinusitis. Electronically Signed: By: Jackey Loge DO On: 02/29/2020 16:23   CT Code Stroke CTA Neck W/WO contrast  Result Date: 02/29/2020 CLINICAL DATA:  Syncopal episode.  Lethargy. EXAM: CT ANGIOGRAPHY HEAD AND NECK CT PERFUSION BRAIN TECHNIQUE: Multidetector CT imaging of the head and neck was performed using the standard protocol during bolus administration of intravenous contrast. Multiplanar CT image reconstructions and MIPs were  obtained to evaluate the vascular anatomy. Carotid stenosis measurements (when applicable) are obtained utilizing NASCET criteria, using the distal internal carotid diameter as the denominator. Multiphase CT imaging of the brain was performed following IV bolus contrast injection. Subsequent parametric perfusion maps were calculated using RAPID software. CONTRAST:  OMNIPAQUE IOHEXOL 350 MG/ML SOLN COMPARISON:  Head CT earlier same day.  MRI 03/21/2017. FINDINGS: CTA NECK FINDINGS Aortic arch: Normal Right carotid system: Normal. No bifurcation disease. Cervical ICA widely patent. Left carotid system: Normal. No bifurcation disease. Cervical ICA widely patent. Vertebral arteries: Both vertebral artery origins widely patent. Both vertebral arteries normal through the cervical region to the foramen magnum. Skeleton: Mild midcervical spondylosis.  No significant finding. Other neck: No mass or lymphadenopathy. Upper chest: Normal Review of the MIP images confirms the above findings CTA HEAD FINDINGS Anterior circulation: Both internal carotid arteries are patent through the skull base  and siphon regions. There is some siphon atherosclerotic calcification but no stenosis greater than 30%. The anterior and middle cerebral vessels are patent. No large or medium vessel occlusion. Posterior circulation: Both vertebral arteries are widely patent through the foramen magnum to the basilar. No basilar stenosis. Posterior circulation branch vessels are normal. Venous sinuses: Patent and normal. Anatomic variants: None significant. Review of the MIP images confirms the above findings CT Brain Perfusion Findings: ASPECTS: 10 CBF (<30%) Volume: 3mL Perfusion (Tmax>6.0s) volume: 8mL Mismatch Volume: None Infarction Location:None IMPRESSION: Normal CT angiography of the neck and head, other than ordinary atherosclerotic calcification in the carotid siphon regions but without significant narrowing. No large or medium vessel  occlusion. Normal perfusion study. Electronically Signed   By: Paulina Fusi M.D.   On: 02/29/2020 19:38   MR Brain W and Wo Contrast  Result Date: 02/29/2020 CLINICAL DATA:  Syncope and encephalopathy EXAM: MRI HEAD WITHOUT AND WITH CONTRAST TECHNIQUE: Multiplanar, multiecho pulse sequences of the brain and surrounding structures were obtained without and with intravenous contrast. CONTRAST:  65mL GADAVIST GADOBUTROL 1 MMOL/ML IV SOLN COMPARISON:  Brain MRI 03/21/2017 CT perfusion 02/29/2020 FINDINGS: Brain: There is faint diffusion-weighted imaging abnormality with corresponding ADC depression within the left midbrain, near the location of the oculomotor nerve nucleus. No other diffusion abnormality. No acute or chronic hemorrhage. There is multifocal hyperintense T2-weighted signal within the white matter. Parenchymal volume and CSF spaces are normal. The midline structures are normal. There is no abnormal contrast enhancement. Vascular: Major flow voids are preserved. Skull and upper cervical spine: Normal calvarium and skull base. Visualized upper cervical spine and soft tissues are normal. Sinuses/Orbits:No paranasal sinus fluid levels or advanced mucosal thickening. No mastoid or middle ear effusion. Normal orbits. IMPRESSION: 1. Likely acute/subacute infarct within the left midbrain, near the location of the oculomotor nerve nucleus. This may indicate a small area of acute ischemia. No hemorrhage or mass effect. 2. Minimal white matter hyperintensity which may be seen in the context of migraine headaches. Electronically Signed   By: Deatra Robinson M.D.   On: 02/29/2020 21:57   CT Code Stroke Cerebral Perfusion with contrast  Result Date: 02/29/2020 CLINICAL DATA:  Syncopal episode.  Lethargy. EXAM: CT ANGIOGRAPHY HEAD AND NECK CT PERFUSION BRAIN TECHNIQUE: Multidetector CT imaging of the head and neck was performed using the standard protocol during bolus administration of intravenous contrast.  Multiplanar CT image reconstructions and MIPs were obtained to evaluate the vascular anatomy. Carotid stenosis measurements (when applicable) are obtained utilizing NASCET criteria, using the distal internal carotid diameter as the denominator. Multiphase CT imaging of the brain was performed following IV bolus contrast injection. Subsequent parametric perfusion maps were calculated using RAPID software. CONTRAST:  OMNIPAQUE IOHEXOL 350 MG/ML SOLN COMPARISON:  Head CT earlier same day.  MRI 03/21/2017. FINDINGS: CTA NECK FINDINGS Aortic arch: Normal Right carotid system: Normal. No bifurcation disease. Cervical ICA widely patent. Left carotid system: Normal. No bifurcation disease. Cervical ICA widely patent. Vertebral arteries: Both vertebral artery origins widely patent. Both vertebral arteries normal through the cervical region to the foramen magnum. Skeleton: Mild midcervical spondylosis.  No significant finding. Other neck: No mass or lymphadenopathy. Upper chest: Normal Review of the MIP images confirms the above findings CTA HEAD FINDINGS Anterior circulation: Both internal carotid arteries are patent through the skull base and siphon regions. There is some siphon atherosclerotic calcification but no stenosis greater than 30%. The anterior and middle cerebral vessels are patent. No large or medium  vessel occlusion. Posterior circulation: Both vertebral arteries are widely patent through the foramen magnum to the basilar. No basilar stenosis. Posterior circulation branch vessels are normal. Venous sinuses: Patent and normal. Anatomic variants: None significant. Review of the MIP images confirms the above findings CT Brain Perfusion Findings: ASPECTS: 10 CBF (<30%) Volume: 0mL Perfusion (Tmax>6.0s) volume: 0mL Mismatch Volume: None Infarction Location:None IMPRESSION: Normal CT angiography of the neck and head, other than ordinary atherosclerotic calcification in the carotid siphon regions but without  significant narrowing. No large or medium vessel occlusion. Normal perfusion study. Electronically Signed   By: Paulina FusiMark  Shogry M.D.   On: 02/29/2020 19:38   DG Chest Portable 1 View  Result Date: 02/29/2020 CLINICAL DATA:  Cough EXAM: PORTABLE CHEST 1 VIEW COMPARISON:  09/05/2012 FINDINGS: The heart size and mediastinal contours are within normal limits. Both lungs are clear. The visualized skeletal structures are unremarkable. IMPRESSION: Negative. Electronically Signed   By: Charlett NoseKevin  Dover M.D.   On: 02/29/2020 19:52    Wt Readings from Last 3 Encounters:  02/29/20 74.8 kg    EKG: Normal sinus rhythm  Physical Exam:  Blood pressure (!) 153/90, pulse 60, temperature 99.1 F (37.3 C), temperature source Oral, resp. rate 13, height 5\' 8"  (1.727 m), weight 74.8 kg, SpO2 95 %. Body mass index is 25.09 kg/m. General: Well developed, well nourished, in no acute distress. Head: Normocephalic, atraumatic, sclera non-icteric, no xanthomas, nares are without discharge.  Neck: Negative for carotid bruits. JVD not elevated. Lungs: Clear bilaterally to auscultation without wheezes, rales, or rhonchi. Breathing is unlabored. Heart: RRR with S1 S2. No murmurs, rubs, or gallops appreciated. Abdomen: Soft, non-tender, non-distended with normoactive bowel sounds. No hepatomegaly. No rebound/guarding. No obvious abdominal masses. Msk:  Strength and tone appear normal for age. Extremities: No clubbing or cyanosis. No edema.  Distal pedal pulses are 2+ and equal bilaterally. Neuro: Alert and oriented X 3. No facial asymmetry. No focal deficit. Moves all extremities spontaneously. Psych:  Responds to questions appropriately with a normal affect.     Assessment and Plan  55 year old female no admitted with possible brainstem CVA.  Echocardiogram showed no cardiac source of emboli.  Sinus rhythm.  Further work-up per neurology.  TEE requested.  Discussed risk and benefits of procedure with the patient and her  husband.  Plan TEE later today with further recommendations after this is completed.  Caren MacadamSigned, Canden Cieslinski A Andri Prestia MD 03/01/2020, 9:02 AM Pager: [phone]

## 2020-03-01 NOTE — Procedures (Signed)
   TRANSESOPHAGEAL ECHOCARDIOGRAM   NAME:  Pamela Clayton   MRN: [id number] DOB:  17-Jul-1965   ADMIT DATE: 02/29/2020  INDICATIONS: Stroke  PROCEDURE:   Informed consent was obtained prior to the procedure. The risks, benefits and alternatives for the procedure were discussed and the patient comprehended these risks.  Risks include, but are not limited to, cough, sore throat, vomiting, nausea, somnolence, esophageal and stomach trauma or perforation, bleeding, low blood pressure, aspiration, pneumonia, infection, trauma to the teeth and death.    After a procedural time-out, the patient was sedated with propofol per department of anesthesia.  The transesophageal probe was inserted in the esophagus and stomach without difficulty and multiple views were obtained. I was present for the entire procedure.    COMPLICATIONS:    There were no immediate complications.  FINDINGS:  LEFT VENTRICLE: EF = 55%. No regional wall motion abnormalities.  RIGHT VENTRICLE: Normal size and function.   LEFT ATRIUM: Normal with no thrombus  LEFT ATRIAL APPENDAGE: No thrombus. Normal doppler flow  RIGHT ATRIUM: Normal  AORTIC VALVE:  Trileaflet. No AI. No vegetations or thrombus  MITRAL VALVE:    Normal. Normal. Trivial MR. No thrombus or vegetations  TRICUSPID VALVE: Normal. No thrombus  PULMONIC VALVE: Grossly normal.  INTERATRIAL SEPTUM: No PFO or ASD. Agitated saline contrast was used.   PERICARDIUM: No effusion  DESCENDING AORTA:  Normal CONCLUSION: No cardiac source of emboli

## 2020-03-01 NOTE — Evaluation (Signed)
Occupational Therapy Evaluation Patient Details Name: ALBANA SAPERSTEIN MRN: [id number] DOB: 08-16-1965 Today's Date: 03/01/2020    History of Present Illness Manie SHARA HARTIS is a 55 y.o. 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   Clinical Impression   Ms Spillane was seen for OT evaluation this date. Prior to hospital admission, pt was Independent in all mobility, including working and completing certificate of medical billing/coding. Pt lives with her spouse in a 1 story home with 3 STE. Currently pt reporting symptoms are resolving, mild LUE weakness and increased time for coordination, however completes all ADLs with SUPERVISION only. Pt demonstrates near baseline independence to perform ADL and mobility tasks and no sensory, cognitive, or visual deficits appreciated with assessment. Mild word finding difficulty noted at end of session as pt fatigued. No skilled OT needs identified. Will sign off. Please re-consult if additional OT needs arise.     Follow Up Recommendations  No OT follow up    Equipment Recommendations  None recommended by OT    Recommendations for Other Services       Precautions / Restrictions Precautions Precautions: None Restrictions Weight Bearing Restrictions: No      Mobility Bed Mobility Overal bed mobility: Independent                  Transfers Overall transfer level: Needs assistance Equipment used: None Transfers: Sit to/from Stand Sit to Stand: Supervision         General transfer comment: SUPERVISION for mobility for lines/leads mgmt and mild LOBs    Balance Overall balance assessment: Needs assistance Sitting-balance support: No upper extremity supported;Feet  supported Sitting balance-Leahy Scale: Normal     Standing balance support: No upper extremity supported;During functional activity Standing balance-Leahy Scale: Good                             ADL either performed or assessed with clinical judgement   ADL Overall ADL's : Independent                                                          Pertinent Vitals/Pain Pain Assessment: No/denies pain     Hand Dominance Right   Extremity/Trunk Assessment Upper Extremity Assessment Upper Extremity Assessment: Overall WFL for tasks assessed;LUE deficits/detail LUE Deficits / Details: Mild LUE strength deficit and decreased speed of coordination - completes all ADLs with increased time   Lower Extremity Assessment Lower Extremity Assessment: Overall WFL for tasks assessed       Communication Communication Communication: Expressive difficulties (word finding deficits)   Cognition Arousal/Alertness: Awake/alert Behavior During Therapy: WFL for tasks assessed/performed Overall Cognitive Status: Within Functional Limits for tasks assessed                                     General Comments       Exercises Exercises: Other exercises Other Exercises Other Exercises: Pt and family educated re:  OT role, DME recs, d/c recs, falls prevention, HEP, home/routines modifications Other Exercises: LBD, toileting, sup<.sit x2, sit<>stand x3, in room ~40 ft mobility, funcitonal reach, hand hygiene   Shoulder Instructions      Home Living Family/patient expects to be discharged to:: Private residence Living Arrangements: Spouse/significant other Available Help at Discharge: Family Type of Home: House Home Access: Stairs to enter Secretary/administrator of Steps: 3 Entrance Stairs-Rails: Can reach both Home Layout: One level     Bathroom Shower/Tub: Tub/shower unit         Home Equipment: Hand held shower head      Lives With:  Spouse    Prior Functioning/Environment Level of Independence: Independent        Comments: 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        OT Problem List: Impaired balance (sitting and/or standing)      OT Treatment/Interventions:      OT Goals(Current goals can be found in the care plan section) Acute Rehab OT Goals Patient Stated Goal: To return home OT Goal Formulation: With patient/family Time For Goal Achievement: 03/15/20 Potential to Achieve Goals: Good   AM-PAC OT "6 Clicks" Daily Activity     Outcome Measure Help from another person eating meals?: None Help from another person taking care of personal grooming?: None Help from another person toileting, which includes using toliet, bedpan, or urinal?: None Help from another person bathing (including washing, rinsing, drying)?: A Little Help from another person to put on and taking off regular upper body clothing?: None Help from another person to put on and taking off regular lower body clothing?: None 6 Click Score: 23   End of Session    Activity Tolerance: Patient tolerated treatment well Patient left: in bed;with call bell/phone within reach;with family/visitor present  OT Visit Diagnosis: Other abnormalities of gait and mobility (R26.89)                Time: 5465-0354 OT Time Calculation (min): 30 min Charges:  OT General Charges $OT Visit: 1 Visit OT Evaluation $OT Eval Moderate Complexity: 1 Mod OT Treatments $Self Care/Home Management : 23-37 mins  Kathie Dike, M.S. OTR/L  03/01/20, 12:35 PM  ascom [phone]

## 2020-03-01 NOTE — Discharge Summary (Signed)
Physician Discharge Summary  Pamela Closeerri S Hanner ZOX:[id number] DOB: 11-16-1965 DOA: 02/29/2020  PCP: Titus MouldWhite, Elizabeth Burney, NP  Admit date: 02/29/2020 Discharge date: 03/01/2020  Admitted From: Home Disposition: Home  Recommendations for Outpatient Follow-up:  1. Follow up with PCP in 1-2 weeks 2. [facility] in 2 to 3 weeks 3. Follow-up with cardiology for cardiac monitor 4. Please obtain BMP/CBC in one week 5. Please follow up on the following pending results: Hypercoagulable labs  Home Health: Equipment/Devices: None Discharge Condition: Stable CODE STATUS: Full Diet recommendation: Heart Healthy   Brief/Interim Summary: Pamela Clayton is a 55 y.o. 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 Patient's last known normal was at 6 AM on the day of arrival when the husband left for work however patient called him at 2 PM and he noted that she had slurring of the speech, was sounding confused and speaking in nonsensical sentences and being repetitive. EMS reports that patient syncopized with standing while they were there on the scene and she had positive orthostatics.   Initial CT head was negative.  MRI showed a small left mid brain infarct, which is more consistent with small vessel disease.  CTA was negative for any significant stenosis.  Patient does not has diabetes, A1c of 5.5, only hypertension.  Hypercoagulable labs were sent with pending results which can be followed up during her outpatient visit with neurology.  Echocardiogram was within normal limit.  TEE was done to rule out any embolic source and it was normal.  She was given a 30-day monitor by cardiology and might need a implantable  monitor after 30 days, she will follow up with stroke team at [facility] for further recommendations.  Neurology evaluated her and they were recommending aspirin and Plavix for 3 weeks followed by aspirin only.  She was also started on atorvastatin for LDL goal of less than 70.  UDS was positive for tricyclic and opioids.  Patient is on amitriptyline at home.  Per husband she was using a cough syrup with codeine which was prescribed to her in the past at home due to some cough.  Denies any substance use.  She is a non-smoker, drinks alcohol occasionally.  Her home antihypertensives were held for permissive hypertension and she can resume from tomorrow.  She will continue with rest of her home medications and follow-up with her providers.  Discharge Diagnoses:  Active Problems:   Migraine without aura and without status migrainosus, not intractable   History of Spell of altered cognition in 2019   HTN (hypertension)   Acute focal neurological deficit, onset within 3-24 hours   Vitamin B12 deficiency   Acute focal neurological deficit   Acute metabolic encephalopathy   Syncope   Confusion   Dysarthria   Cerebrovascular accident (CVA) [facility](HCC)   Discharge Instructions  Discharge Instructions    Ambulatory referral to Neurology   Complete by: As directed    An appointment is requested in approximately: 2-3 weeks, for stroke follow-up   Diet - low sodium heart healthy   Complete by: As directed    Discharge instructions   Complete by: As directed    It was pleasure taking care of you. You are being given aspirin and  Plavix for 3 weeks and then continue only with aspirin. You are also being started on a cholesterol medication called Lipitor to keep your LDL below 70 for your risk reduction. Please follow-up with [facility] in 2 to 3 weeks, referral has been be provided, they will follow up on your lab work to see if we can find any  cause of your stroke. You can resume your blood pressure medications from tomorrow. Continue with rest of your home medications and follow-up with primary care provider for further management.   Increase activity slowly   Complete by: As directed      Allergies as of 03/01/2020   Not on File     Medication List    TAKE these medications   aspirin 81 MG EC tablet Take 1 tablet (81 mg total) by mouth daily. Swallow whole. Start taking on: March 02, 2020   atorvastatin 40 MG tablet Commonly known as: LIPITOR Take 1 tablet (40 mg total) by mouth daily. Start taking on: March 02, 2020   clopidogrel 75 MG tablet Commonly known as: PLAVIX Take 1 tablet (75 mg total) by mouth daily.   losartan 100 MG tablet Commonly known as: COZAAR Take 100 mg by mouth daily.   Mucinex Maximum Strength 1200 MG Tb12 Generic drug: Guaifenesin Take 1,200 mg by mouth every 12 (twelve) hours as needed (congestion).   Multivitamin Gummies Womens Weyerhaeuser CompanyChew Chew 1 tablet by mouth daily.   nortriptyline 10 MG capsule Commonly known as: PAMELOR Take 20 mg by mouth at bedtime.   SUMAtriptan 100 MG tablet Commonly known as: IMITREX Take 50-100 mg by mouth daily as needed for migraine. May repeat in 2 hours if headache persists or recurs.   topiramate 50 MG tablet Commonly known as: TOPAMAX Take 50 mg by mouth 2 (two) times daily.   venlafaxine XR 150 MG 24 hr capsule Commonly known as: EFFEXOR-XR Take 150 mg by mouth daily.   vitamin B-12 1000 MCG tablet Commonly known as: CYANOCOBALAMIN Take 1,000 mcg by mouth daily.   vitamin C 500 MG tablet Commonly known as: ASCORBIC ACID Take 500 mg by mouth daily.       Follow-up Information    Titus MouldWhite, Elizabeth Burney, NP. Schedule an appointment as soon as possible for a visit.   Specialty: Family Medicine Contact information: [address] [place] [id number] [phone]              Not on  File  Consultations:  Neurology  Cardiology  Procedures/Studies: CT Code Stroke CTA Head W/WO contrast  Result Date: 02/29/2020 CLINICAL DATA:  Syncopal episode.  Lethargy. EXAM: CT ANGIOGRAPHY HEAD AND NECK CT PERFUSION BRAIN TECHNIQUE: Multidetector CT imaging of the head and neck was performed using the standard protocol during bolus administration of intravenous contrast. Multiplanar CT image reconstructions and MIPs were obtained to evaluate the vascular anatomy. Carotid stenosis measurements (when applicable) are obtained utilizing NASCET criteria, using the distal internal carotid diameter as the denominator. Multiphase CT imaging of the brain was performed following IV bolus contrast injection. Subsequent parametric perfusion maps were calculated using RAPID software. CONTRAST:  100mL OMNIPAQUE IOHEXOL 350 MG/ML SOLN COMPARISON:  Head CT earlier same day.  MRI 03/21/2017. FINDINGS: CTA NECK FINDINGS Aortic arch: Normal Right carotid system: Normal. No bifurcation disease. Cervical ICA widely patent. Left carotid system: Normal. No bifurcation disease. Cervical ICA widely patent. Vertebral arteries: Both vertebral artery origins widely patent. Both vertebral arteries normal through the cervical  region to the foramen magnum. Skeleton: Mild midcervical spondylosis.  No significant finding. Other neck: No mass or lymphadenopathy. Upper chest: Normal Review of the MIP images confirms the above findings CTA HEAD FINDINGS Anterior circulation: Both internal carotid arteries are patent through the skull base and siphon regions. There is some siphon atherosclerotic calcification but no stenosis greater than 30%. The anterior and middle cerebral vessels are patent. No large or medium vessel occlusion. Posterior circulation: Both vertebral arteries are widely patent through the foramen magnum to the basilar. No basilar stenosis. Posterior circulation branch vessels are normal. Venous sinuses: Patent and  normal. Anatomic variants: None significant. Review of the MIP images confirms the above findings CT Brain Perfusion Findings: ASPECTS: 10 CBF (<30%) Volume: 72mL Perfusion (Tmax>6.0s) volume: 63mL Mismatch Volume: None Infarction Location:None IMPRESSION: Normal CT angiography of the neck and head, other than ordinary atherosclerotic calcification in the carotid siphon regions but without significant narrowing. No large or medium vessel occlusion. Normal perfusion study. Electronically Signed   By: Paulina Fusi M.D.   On: 02/29/2020 19:38   CT Head Wo Contrast  Addendum Date: 02/29/2020   ADDENDUM REPORT: 02/29/2020 19:23 ADDENDUM: I was subsequently contacted by Dr. Milon Dikes of neurology who informed me that a code stroke was activated for this patient shortly after my interpretation of the non-contrast head CT. There is an ASPECTS of 10 this examination. Electronically Signed   By: Jackey Loge DO   On: 02/29/2020 19:23   Result Date: 02/29/2020 CLINICAL DATA:  Mental status change, persistent or worsening. Additional history provided: Syncopal episode, disorientation EXAM: CT HEAD WITHOUT CONTRAST TECHNIQUE: Contiguous axial images were obtained from the base of the skull through the vertex without intravenous contrast. COMPARISON:  Brain MRI 03/21/2017. Report from head CT 03/22/1999 (images unavailable). FINDINGS: Brain: Cerebral volume is normal for age. Mild ill-defined hypoattenuation within the cerebral white matter is nonspecific, but most often secondary to chronic small vessel ischemia. There is no acute intracranial hemorrhage. No demarcated cortical infarct. No extra-axial fluid collection. No evidence of intracranial mass. No midline shift. Vascular: No hyperdense vessel.  Atherosclerotic calcifications. Skull: Chronic of the left frontoparietal calvarium, which may be posttraumatic (series 3, image 49). No acute calvarial fracture or focal suspicious osseous lesion. Sinuses/Orbits:  Visualized orbits show no acute finding. Paranasal sinus disease at the imaged levels. Most notably, moderate mucosal thickening and a small air-fluid level are present within the right sphenoid sinus. IMPRESSION: No evidence of acute intracranial abnormality. Redemonstrated mild cerebral white matter disease which is nonspecific, but most often related to chronic small vessel ischemia. 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 Correlate with the medical history Paranasal sinus disease, most notably right sphenoid sinusitis. Electronically Signed: By: Jackey Loge DO On: 02/29/2020 16:23   CT Code Stroke CTA Neck W/WO contrast  Result Date: 02/29/2020 CLINICAL DATA:  Syncopal episode.  Lethargy. EXAM: CT ANGIOGRAPHY HEAD AND NECK CT PERFUSION BRAIN TECHNIQUE: Multidetector CT imaging of the head and neck was performed using the standard protocol during bolus administration of intravenous contrast. Multiplanar CT image reconstructions and MIPs were obtained to evaluate the vascular anatomy. Carotid stenosis measurements (when applicable) are obtained utilizing NASCET criteria, using the distal internal carotid diameter as the denominator. Multiphase CT imaging of the brain was performed following IV bolus contrast injection. Subsequent parametric perfusion maps were calculated using RAPID software. CONTRAST:  OMNIPAQUE IOHEXOL 350 MG/ML SOLN COMPARISON:  Head CT earlier same day.  MRI 03/21/2017. FINDINGS:  CTA NECK FINDINGS Aortic arch: Normal Right carotid system: Normal. No bifurcation disease. Cervical ICA widely patent. Left carotid system: Normal. No bifurcation disease. Cervical ICA widely patent. Vertebral arteries: Both vertebral artery origins widely patent. Both vertebral arteries normal through the cervical region to the foramen magnum. Skeleton: Mild midcervical spondylosis.  No significant finding. Other neck: No mass or lymphadenopathy. Upper chest: Normal  Review of the MIP images confirms the above findings CTA HEAD FINDINGS Anterior circulation: Both internal carotid arteries are patent through the skull base and siphon regions. There is some siphon atherosclerotic calcification but no stenosis greater than 30%. The anterior and middle cerebral vessels are patent. No large or medium vessel occlusion. Posterior circulation: Both vertebral arteries are widely patent through the foramen magnum to the basilar. No basilar stenosis. Posterior circulation branch vessels are normal. Venous sinuses: Patent and normal. Anatomic variants: None significant. Review of the MIP images confirms the above findings CT Brain Perfusion Findings: ASPECTS: 10 CBF (<30%) Volume: 0mL Perfusion (Tmax>6.0s) volume: 0mL Mismatch Volume: None Infarction Location:None IMPRESSION: Normal CT angiography of the neck and head, other than ordinary atherosclerotic calcification in the carotid siphon regions but without significant narrowing. No large or medium vessel occlusion. Normal perfusion study. Electronically Signed   By: Paulina Fusi M.D.   On: 02/29/2020 19:38   MR Brain W and Wo Contrast  Result Date: 02/29/2020 CLINICAL DATA:  Syncope and encephalopathy EXAM: MRI HEAD WITHOUT AND WITH CONTRAST TECHNIQUE: Multiplanar, multiecho pulse sequences of the brain and surrounding structures were obtained without and with intravenous contrast. CONTRAST:  7mL GADAVIST GADOBUTROL 1 MMOL/ML IV SOLN COMPARISON:  Brain MRI 03/21/2017 CT perfusion 02/29/2020 FINDINGS: Brain: There is faint diffusion-weighted imaging abnormality with corresponding ADC depression within the left midbrain, near the location of the oculomotor nerve nucleus. No other diffusion abnormality. No acute or chronic hemorrhage. There is multifocal hyperintense T2-weighted signal within the white matter. Parenchymal volume and CSF spaces are normal. The midline structures are normal. There is no abnormal contrast enhancement.  Vascular: Major flow voids are preserved. Skull and upper cervical spine: Normal calvarium and skull base. Visualized upper cervical spine and soft tissues are normal. Sinuses/Orbits:No paranasal sinus fluid levels or advanced mucosal thickening. No mastoid or middle ear effusion. Normal orbits. IMPRESSION: 1. Likely acute/subacute infarct within the left midbrain, near the location of the oculomotor nerve nucleus. This may indicate a small area of acute ischemia. No hemorrhage or mass effect. 2. Minimal white matter hyperintensity which may be seen in the context of migraine headaches. Electronically Signed   By: Deatra Robinson M.D.   On: 02/29/2020 21:57   CT Code Stroke Cerebral Perfusion with contrast  Result Date: 02/29/2020 CLINICAL DATA:  Syncopal episode.  Lethargy. EXAM: CT ANGIOGRAPHY HEAD AND NECK CT PERFUSION BRAIN TECHNIQUE: Multidetector CT imaging of the head and neck was performed using the standard protocol during bolus administration of intravenous contrast. Multiplanar CT image reconstructions and MIPs were obtained to evaluate the vascular anatomy. Carotid stenosis measurements (when applicable) are obtained utilizing NASCET criteria, using the distal internal carotid diameter as the denominator. Multiphase CT imaging of the brain was performed following IV bolus contrast injection. Subsequent parametric perfusion maps were calculated using RAPID software. CONTRAST:  OMNIPAQUE IOHEXOL 350 MG/ML SOLN COMPARISON:  Head CT earlier same day.  MRI 03/21/2017. FINDINGS: CTA NECK FINDINGS Aortic arch: Normal Right carotid system: Normal. No bifurcation disease. Cervical ICA widely patent. Left carotid system: Normal. No bifurcation disease. Cervical ICA widely  patent. Vertebral arteries: Both vertebral artery origins widely patent. Both vertebral arteries normal through the cervical region to the foramen magnum. Skeleton: Mild midcervical spondylosis.  No significant finding. Other neck: No  mass or lymphadenopathy. Upper chest: Normal Review of the MIP images confirms the above findings CTA HEAD FINDINGS Anterior circulation: Both internal carotid arteries are patent through the skull base and siphon regions. There is some siphon atherosclerotic calcification but no stenosis greater than 30%. The anterior and middle cerebral vessels are patent. No large or medium vessel occlusion. Posterior circulation: Both vertebral arteries are widely patent through the foramen magnum to the basilar. No basilar stenosis. Posterior circulation branch vessels are normal. Venous sinuses: Patent and normal. Anatomic variants: None significant. Review of the MIP images confirms the above findings CT Brain Perfusion Findings: ASPECTS: 10 CBF (<30%) Volume: 0mL Perfusion (Tmax>6.0s) volume: 0mL Mismatch Volume: None Infarction Location:None IMPRESSION: Normal CT angiography of the neck and head, other than ordinary atherosclerotic calcification in the carotid siphon regions but without significant narrowing. No large or medium vessel occlusion. Normal perfusion study. Electronically Signed   By: Paulina Fusi M.D.   On: 02/29/2020 19:38   DG Chest Portable 1 View  Result Date: 02/29/2020 CLINICAL DATA:  Cough EXAM: PORTABLE CHEST 1 VIEW COMPARISON:  09/05/2012 FINDINGS: The heart size and mediastinal contours are within normal limits. Both lungs are clear. The visualized skeletal structures are unremarkable. IMPRESSION: Negative. Electronically Signed   By: Charlett Nose M.D.   On: 02/29/2020 19:52    Subjective: Patient was feeling better when seen today.  Husband at bedside.  Denies any neuro deficit.  No family history of early stroke.  According to husband her speech is improving.  Discharge Exam: Vitals:   03/01/20 1445 03/01/20 1500  BP:  125/84  Pulse: (!) 56 79  Resp: 18 11  Temp:    SpO2: 100% 100%   Vitals:   03/01/20 1400 03/01/20 1430 03/01/20 1445 03/01/20 1500  BP: (!) 147/74 (!) 153/82   125/84  Pulse: (!) 52 66 (!) 56 79  Resp: 13 16 18 11   Temp:      TempSrc:      SpO2: 99% 100% 100% 100%  Weight:      Height:        General: Pt is alert, awake, not in acute distress Cardiovascular: RRR, S1/S2 +, no rubs, no gallops Respiratory: CTA bilaterally, no wheezing, no rhonchi Abdominal: Soft, NT, ND, bowel sounds + Extremities: no edema, no cyanosis   The results of significant diagnostics from this hospitalization (including imaging, microbiology, ancillary and laboratory) are listed below for reference.    Microbiology: Recent Results (from the past 240 hour(s))  SARS Coronavirus 2 by RT PCR (hospital order, performed in [facility] hospital lab) Nasopharyngeal Nasopharyngeal Swab     Status: None   Collection Time: 02/29/20  6:54 PM   Specimen: Nasopharyngeal Swab  Result Value Ref Range Status   SARS Coronavirus 2 NEGATIVE NEGATIVE Final    Comment: (NOTE) SARS-CoV-2 target nucleic acids are NOT DETECTED.  The SARS-CoV-2 RNA is generally detectable in upper and lower respiratory specimens during the acute phase of infection. The lowest concentration of SARS-CoV-2 viral copies this assay can detect is 250 copies / mL. A negative result does not preclude SARS-CoV-2 infection and should not be used as the sole basis for treatment or other patient management decisions.  A negative result may occur with improper specimen collection / handling, submission of specimen other  than nasopharyngeal swab, presence of viral mutation(s) within the areas targeted by this assay, and inadequate number of viral copies (<250 copies / mL). A negative result must be combined with clinical observations, patient history, and epidemiological information.  Fact Sheet for Patients:   BoilerBrush.com.cy  Fact Sheet for Healthcare Providers: https://pope.com/  This test is not yet approved or  cleared by the [place] FDA and has  been authorized for detection and/or diagnosis of SARS-CoV-2 by FDA under an Emergency Use Authorization (EUA).  This EUA will remain in effect (meaning this test can be used) for the duration of the COVID-19 declaration under Section 564(b)(1) of the Act, 21 U.S.C. section 360bbb-3(b)(1), unless the authorization is terminated or revoked sooner.  Performed at [facility], [address] [place], [place] [address]      Labs: BNP (last 3 results) No results for input(s): BNP in the last 8760 hours. Basic Metabolic Panel: Recent Labs  Lab 02/29/20 1550 02/29/20 1901  NA 141  --   K 4.1  --   CL 105  --   CO2 26  --   GLUCOSE 102*  --   BUN 14  --   CREATININE 0.80 0.91  CALCIUM 9.5  --    Liver Function Tests: Recent Labs  Lab 02/29/20 1550  AST 38  ALT 39  ALKPHOS 84  BILITOT 0.7  PROT 7.4  ALBUMIN 4.3   No results for input(s): LIPASE, AMYLASE in the last 168 hours. No results for input(s): AMMONIA in the last 168 hours. CBC: Recent Labs  Lab 02/29/20 1550 02/29/20 1901  WBC 4.2 4.4  HGB 12.9 13.0  HCT 38.5 38.6  MCV 91.0 91.0  PLT 190 160   Cardiac Enzymes: No results for input(s): CKTOTAL, CKMB, CKMBINDEX, TROPONINI in the last 168 hours. BNP: Invalid input(s): POCBNP CBG: No results for input(s): GLUCAP in the last 168 hours. D-Dimer No results for input(s): DDIMER in the last 72 hours. Hgb A1c Recent Labs    03/01/20 0513  HGBA1C 5.5   Lipid Profile Recent Labs    03/01/20 0513  CHOL 177  HDL 65  LDLCALC 89  TRIG 114  CHOLHDL 2.7   Thyroid function studies No results for input(s): TSH, T4TOTAL, T3FREE, THYROIDAB in the last 72 hours.  Invalid input(s): FREET3 Anemia work up No results for input(s): VITAMINB12, FOLATE, FERRITIN, TIBC, IRON, RETICCTPCT in the last 72 hours. Urinalysis    Component Value Date/Time   COLORURINE YELLOW (A) 03/01/2020 0510   APPEARANCEUR CLOUDY (A) 03/01/2020 0510   LABSPEC >1.046 (H)  03/01/2020 0510   PHURINE 5.0 03/01/2020 0510   GLUCOSEU NEGATIVE 03/01/2020 0510   HGBUR NEGATIVE 03/01/2020 0510   BILIRUBINUR NEGATIVE 03/01/2020 0510   KETONESUR 5 (A) 03/01/2020 0510   PROTEINUR NEGATIVE 03/01/2020 0510   NITRITE NEGATIVE 03/01/2020 0510   LEUKOCYTESUR LARGE (A) 03/01/2020 0510   Sepsis Labs Invalid input(s): PROCALCITONIN,  WBC,  LACTICIDVEN Microbiology Recent Results (from the past 240 hour(s))  SARS Coronavirus 2 by RT PCR (hospital order, performed in [facility] hospital lab) Nasopharyngeal Nasopharyngeal Swab     Status: None   Collection Time: 02/29/20  6:54 PM   Specimen: Nasopharyngeal Swab  Result Value Ref Range Status   SARS Coronavirus 2 NEGATIVE NEGATIVE Final    Comment: (NOTE) SARS-CoV-2 target nucleic acids are NOT DETECTED.  The SARS-CoV-2 RNA is generally detectable in upper and lower respiratory specimens during the acute phase of infection. The lowest concentration  of SARS-CoV-2 viral copies this assay can detect is 250 copies / mL. A negative result does not preclude SARS-CoV-2 infection and should not be used as the sole basis for treatment or other patient management decisions.  A negative result may occur with improper specimen collection / handling, submission of specimen other than nasopharyngeal swab, presence of viral mutation(s) within the areas targeted by this assay, and inadequate number of viral copies (<250 copies / mL). A negative result must be combined with clinical observations, patient history, and epidemiological information.  Fact Sheet for Patients:   BoilerBrush.com.cy  Fact Sheet for Healthcare Providers: https://pope.com/  This test is not yet approved or  cleared by the [place] FDA and has been authorized for detection and/or diagnosis of SARS-CoV-2 by FDA under an Emergency Use Authorization (EUA).  This EUA will remain in effect (meaning this test can  be used) for the duration of the COVID-19 declaration under Section 564(b)(1) of the Act, 21 U.S.C. section 360bbb-3(b)(1), unless the authorization is terminated or revoked sooner.  Performed at [facility], [address], [place], [place] [address]     Time coordinating discharge: Over 30 minutes  SIGNED:  Arnetha Courser, MD  [facility] 03/01/2020, 4:02 PM  If 7PM-7AM, please contact night-coverage [email or web address]  This record has been created using Conservation officer, historic buildings. Errors have been sought and corrected,but may not always be located. Such creation errors do not reflect on the standard of care.

## 2020-03-01 NOTE — Progress Notes (Signed)
PT Cancellation Note  Patient Details Name: Pamela Clayton MRN: [id number] DOB: [birth year]   Cancelled Treatment:    Reason Eval/Treat Not Completed: Patient at procedure or test/unavailable.  PT consult received.  Chart reviewed.  Pt currently not in ED room (nurse reports pt off floor at TEE).  Will re-attempt PT session at a later date/time.   Hendricks Limes, PT 03/01/20, 11:33 AM

## 2020-03-01 NOTE — Progress Notes (Addendum)
Neurology Progress Note   S:// Patient seen and examined. Reports some improvement in her symptoms specifically eye movement and level of consciousness.  O:// Current vital signs: BP (!) 135/99   Pulse (!) 123   Temp 99.1 F (37.3 C) (Oral)   Resp 11   Ht 5\' 8"  (1.727 m)   Wt 74.8 kg   SpO2 97%   BMI 25.09 kg/m  Vital signs in last 24 hours: Temp:  [99.1 F (37.3 C)] 99.1 F (37.3 C) (01/24 1545) Pulse Rate:  [45-128] 123 (01/25 0945) Resp:  [10-26] 11 (01/25 0945) BP: (99-153)/(70-99) 135/99 (01/25 0945) SpO2:  [95 %-100 %] 97 % (01/25 0945) Weight:  [74.8 kg] 74.8 kg (01/24 1546) General: Awake alert, feeling tired but in no apparent distress HEENT: Normocephalic/atraumatic CVS regular rhythm Respiratory breathing well and saturating normally on room air Abdomen nondistended nontender Extremities warm well perfused no edema Neurological exam Awake alert oriented x3 Speech is still mildly dysarthric although improved from yesterday as seen on camera. No evidence of aphasia Cranial nerve examination reveals equal pupils round reactive to light, extraocular movements are also different from what was seen on camera yesterday-no evidence of INO or gaze restriction of the left eye as had been seen yesterday-she is able to track the examiner's fingers completely with both eyes without any restriction, no diplopia, facial sensation intact, face symmetric, auditory acuity intact, tongue and palate midline. Motor exam: 5/5 in all fours without any drift Sensation intact to touch without any extinction on double simultaneous stimulation Coordination: No dysmetria Gait, cautious but no gross deficit.  Medications  Current Facility-Administered Medications:  .   stroke: mapping our early stages of recovery book, , Does not apply, Once, 10-26-1988, MD .  0.9 %  sodium chloride infusion, , Intravenous, Continuous, Andris Baumann, MD, Last Rate: 75 mL/hr at 02/29/20 2212, New  Bag at 02/29/20 2212 .  acetaminophen (TYLENOL) tablet 650 mg, 650 mg, Oral, Q4H PRN **OR** acetaminophen (TYLENOL) 160 MG/5ML solution 650 mg, 650 mg, Per Tube, Q4H PRN **OR** acetaminophen (TYLENOL) suppository 650 mg, 650 mg, Rectal, Q4H PRN, 2213, MD .  aspirin EC tablet 81 mg, 81 mg, Oral, Daily, Andris Baumann V, MD .  atorvastatin (LIPITOR) tablet 40 mg, 40 mg, Oral, Daily, Lindajo Royal V, MD, 40 mg at 02/29/20 2214 .  enoxaparin (LOVENOX) injection 40 mg, 40 mg, Subcutaneous, Q24H, 2215, MD, 40 mg at 02/29/20 2212 .  SUMAtriptan (IMITREX) tablet 50-100 mg, 50-100 mg, Oral, Daily PRN, 2213, MD  Current Outpatient Medications:  Andris Baumann  Guaifenesin (MUCINEX MAXIMUM STRENGTH) 1200 MG TB12, Take 1,200 mg by mouth every 12 (twelve) hours as needed (congestion)., Disp: , Rfl:  .  losartan (COZAAR) 100 MG tablet, Take 100 mg by mouth daily., Disp: , Rfl:  .  Multiple Vitamins-Minerals (MULTIVITAMIN GUMMIES WOMENS) CHEW, Chew 1 tablet by mouth daily., Disp: , Rfl:  .  nortriptyline (PAMELOR) 10 MG capsule, Take 20 mg by mouth at bedtime., Disp: , Rfl:  .  SUMAtriptan (IMITREX) 100 MG tablet, Take 50-100 mg by mouth daily as needed for migraine. May repeat in 2 hours if headache persists or recurs., Disp: , Rfl:  .  topiramate (TOPAMAX) 50 MG tablet, Take 50 mg by mouth 2 (two) times daily., Disp: , Rfl:  .  venlafaxine XR (EFFEXOR-XR) 150 MG 24 hr capsule, Take 150 mg by mouth daily., Disp: , Rfl:  .  vitamin B-12 (CYANOCOBALAMIN) 1000  MCG tablet, Take 1,000 mcg by mouth daily., Disp: , Rfl:  .  vitamin C (ASCORBIC ACID) 500 MG tablet, Take 500 mg by mouth daily., Disp: , Rfl:  Labs CBC    Component Value Date/Time   WBC 4.4 02/29/2020 1901   RBC 4.24 02/29/2020 1901   HGB 13.0 02/29/2020 1901   HGB 12.8 09/05/2012 0849   HCT 38.6 02/29/2020 1901   HCT 36.4 09/05/2012 0849   PLT 160 02/29/2020 1901   PLT 236 09/05/2012 0849   MCV 91.0 02/29/2020 1901   MCV 89  09/05/2012 0849   MCH 30.7 02/29/2020 1901   MCHC 33.7 02/29/2020 1901   RDW 11.9 02/29/2020 1901   RDW 13.2 09/05/2012 0849   LYMPHSABS 2.2 09/05/2012 0849   MONOABS 0.3 09/05/2012 0849   EOSABS 0.2 09/05/2012 0849   BASOSABS 0.0 09/05/2012 0849    CMP     Component Value Date/Time   NA 141 02/29/2020 1550   NA 138 09/05/2012 0849   K 4.1 02/29/2020 1550   K 3.7 09/05/2012 0849   CL 105 02/29/2020 1550   CL 104 09/05/2012 0849   CO2 26 02/29/2020 1550   CO2 29 09/05/2012 0849   GLUCOSE 102 (H) 02/29/2020 1550   GLUCOSE 103 (H) 09/05/2012 0849   BUN 14 02/29/2020 1550   BUN 13 09/05/2012 0849   CREATININE 0.91 02/29/2020 1901   CREATININE 1.01 09/05/2012 0849   CALCIUM 9.5 02/29/2020 1550   CALCIUM 8.9 09/05/2012 0849   PROT 7.4 02/29/2020 1550   PROT 7.2 09/05/2012 0849   ALBUMIN 4.3 02/29/2020 1550   ALBUMIN 3.8 09/05/2012 0849   AST 38 02/29/2020 1550   AST 24 09/05/2012 0849   ALT 39 02/29/2020 1550   ALT 32 09/05/2012 0849   ALKPHOS 84 02/29/2020 1550   ALKPHOS 59 09/05/2012 0849   BILITOT 0.7 02/29/2020 1550   BILITOT 0.5 09/05/2012 0849   GFRNONAA >60 02/29/2020 1901   GFRNONAA >60 09/05/2012 0849   GFRAA >60 09/05/2012 0849    glycosylated hemoglobin-pending  Lipid Panel     Component Value Date/Time   CHOL 177 03/01/2020 0513   TRIG 114 03/01/2020 0513   HDL 65 03/01/2020 0513   CHOLHDL 2.7 03/01/2020 0513   VLDL 23 03/01/2020 0513   LDLCALC 89 03/01/2020 0513  LDL 89 Toxicology screen positive for opiates and tricyclic's.   Imaging I have reviewed images in epic and the results pertinent to this consultation are: MRI of the brain shows a acute/subacute infarct in the left midbrain near the location of the oculomotor nerve nucleus.  Likely representative of acute ischemia.  No hemorrhage or mass-effect.  Minimal white matter hyperintensity-not concerning for demyelinating process and more likely changes seen in migraine patients.  CT angio  head and neck-normal with the exception of calcification in carotid siphons bilaterally.  Assessment:  55 year old woman, past history of migraines, hypertension, bilateral carpal tunnel presenting initially for evaluation of syncopal-like episode but noted by the ED provider to have left eye movement deficits as well as speech difficulty. Outside the window for TPA at the time of presentation with last known well at 6 AM yesterday. Examination consistent with a left INO yesterday and moderate dysarthria-localizing to the brainstem.  Other differentials considered medication overdose versus demyelination MRI of the brain shows a small area of acute infarct in the left midbrain, confirming the acute ischemic stroke. No large vessel occlusion in the posterior circulation.  Anterior circulation is unremarkable with the  exception of mild calcification in the carotid siphons.  Impression: -Left midbrain acute ischemic stroke-usually etiology is small vessel disease from uncontrolled hypertension, hyperlipidemia, diabetes and such but the patient only has hypertension.  Hence prudent to look for other causes as below. -Check for hypercoagulable state -Check for a cardiac source embolizing and causing a stroke  Recommendations: Telemetry Frequent neurochecks Dual antiplatelets-aspirin 81 and Plavix 75 for 3 weeks followed by aspirin only. Check A1c Atorvastatin for LDL goal of less than 70 Hypercoagulable work-up 2D echo, and will need TEE after that. Consider long-term cardiac monitoring as well as loop recorder if the above work-up is unremarkable PT OT speech therapy Plan discussed with the admitting hospitalist via secure chat as well as with the patient and husband at bedside. I will continue to follow with you -- Milon Dikes, MD Neurologist [facility] Pager: [phone]    Addendum A1c 5.5. TEE with no intracardiac source of thrombus.  Updated  recommendations: -Recommend outpatient long-term cardiac monitoring-30-day monitoring will be set-follow-up with outpatient neurology after 2 to 4 weeks and if the 30-day cardiac monitoring is unremarkable, might need an implantable loop recorder. -Continue dual antiplatelets for 3 weeks followed by aspirin only. -Continue statin -Follow-up on the hypercoagulable work-up as an outpatient with neurology. -I would recommend she be sent to a stroke clinic-[facility] in [place] or stroke clinic at [facility] or [facility] for neurological follow-up.  Discussed with Dr. Nelson Chimes. -- Milon Dikes, MD Neurologist [facility] Pager: [phone]

## 2020-03-01 NOTE — Progress Notes (Signed)
*  PRELIMINARY RESULTS* Echocardiogram 2D Echocardiogram has been performed.  Pamela Clayton 03/01/2020, 8:56 AM

## 2020-03-01 NOTE — Anesthesia Preprocedure Evaluation (Signed)
Anesthesia Evaluation  Patient identified by MRN, date of birth, ID band Patient awake    Reviewed: Allergy & Precautions, H&P , NPO status , reviewed documented beta blocker date and time   Airway Mallampati: II  TM Distance: >3 FB Neck ROM: limited    Dental  (+) Teeth Intact   Pulmonary Patient abstained from smoking.,    Pulmonary exam normal        Cardiovascular hypertension, Normal cardiovascular exam     Neuro/Psych  Headaches, Pt responsive to loud voice and touch. AMS noted on admission. Consent from husband. CVA    GI/Hepatic   Endo/Other    Renal/GU      Musculoskeletal   Abdominal   Peds  Hematology   Anesthesia Other Findings Past Medical History: No date: Headache No date: Hypertension No date: Stroke [facility]) AMS  History reviewed. No pertinent surgical history.  BMI    Body Mass Index: 25.07 kg/m      Reproductive/Obstetrics                             Anesthesia Physical Anesthesia Plan  ASA: III and emergent  Anesthesia Plan: General   Post-op Pain Management:    Induction: Intravenous  PONV Risk Score and Plan: Treatment may vary due to age or medical condition and TIVA  Airway Management Planned: Nasal Cannula and Natural Airway  Additional Equipment:   Intra-op Plan:   Post-operative Plan:   Informed Consent: I have reviewed the patients History and Physical, chart, labs and discussed the procedure including the risks, benefits and alternatives for the proposed anesthesia with the patient or authorized representative who has indicated his/her understanding and acceptance.     Dental Advisory Given  Plan Discussed with: CRNA  Anesthesia Plan Comments:         Anesthesia Quick Evaluation

## 2020-03-01 NOTE — Progress Notes (Signed)
*  PRELIMINARY RESULTS* Echocardiogram Echocardiogram Transesophageal has been performed.  Cristela Blue 03/01/2020, 12:30 PM

## 2020-03-01 NOTE — Transfer of Care (Signed)
Immediate Anesthesia Transfer of Care Note  Patient: Pamela Clayton  Procedure(s) Performed: TRANSESOPHAGEAL ECHOCARDIOGRAM (TEE) (N/A )  Patient Location: PACU and Cath Lab  Anesthesia Type:General  Level of Consciousness: drowsy  Airway & Oxygen Therapy: Patient Spontanous Breathing and Patient connected to nasal cannula oxygen  Post-op Assessment: Report given to RN  Post vital signs: stable  Last Vitals:  Vitals Value Taken Time  BP    Temp    Pulse    Resp    SpO2      Last Pain:  Vitals:   03/01/20 1144  TempSrc: Oral  PainSc: 0-No pain         Complications: No complications documented.

## 2020-03-01 NOTE — Progress Notes (Signed)
Pt is resting quietly. Pt husband is called to return to the dept. Hospitalist is to discharge pt.

## 2020-03-02 ENCOUNTER — Encounter: Payer: Self-pay | Admitting: Cardiology

## 2020-03-02 LAB — LUPUS ANTICOAGULANT PANEL
DRVVT: 34.4 s (ref 0.0–47.0)
PTT Lupus Anticoagulant: 32.2 s (ref 0.0–51.9)

## 2020-03-02 LAB — HOMOCYSTEINE: Homocysteine: 6.5 umol/L (ref 0.0–14.5)

## 2020-03-02 LAB — PROTEIN S, TOTAL: Protein S Ag, Total: 80 % (ref 60–150)

## 2020-03-02 LAB — PROTEIN C ACTIVITY: Protein C Activity: 154 % (ref 73–180)

## 2020-03-02 LAB — PROTEIN S ACTIVITY: Protein S Activity: 70 % (ref 63–140)

## 2020-03-03 LAB — PROTEIN C, TOTAL: Protein C, Total: 118 % (ref 60–150)

## 2020-03-05 LAB — PROTHROMBIN GENE MUTATION

## 2020-03-07 LAB — FACTOR 5 LEIDEN

## 2021-08-07 IMAGING — CT CT HEAD W/O CM
3 series · 14 of 45 positions shown, 16 images · non-contrast
Comparison: Brain MRI 03/21/2017. Report from head CT 03/22/1999
(images unavailable).
COMPARISON: Brain MRI 03/21/2017. Report from head CT 03/22/1999
(images unavailable).

Addendum:
CLINICAL DATA: Mental status change, persistent or worsening.
Additional history provided: Syncopal episode, disorientation

EXAM:
CT HEAD WITHOUT CONTRAST
TECHNIQUE: Contiguous axial images were obtained from the base of the skull
through the vertex without intravenous contrast.

[Series 2: head wo · axial · 0.39mm/px · z∈[+470,+585]mm · 8 of 28 slices shown, 10 images]
[im 3/28  brain]
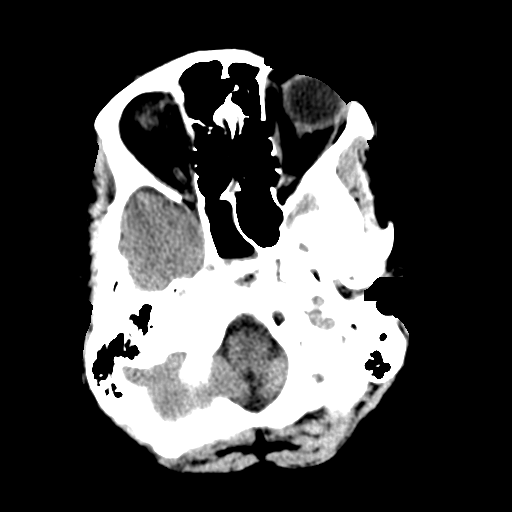
[im 3/28  bone]
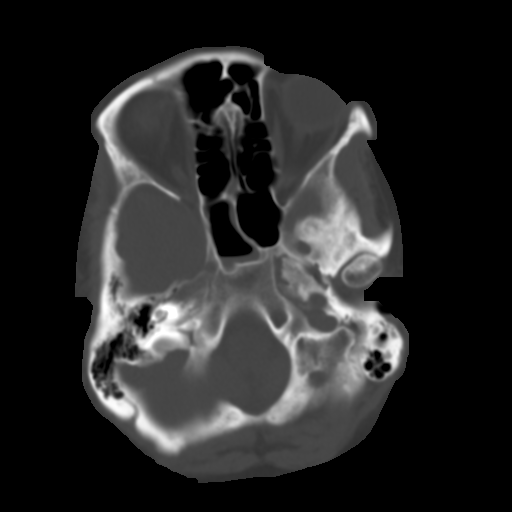
[im 6/28  brain]
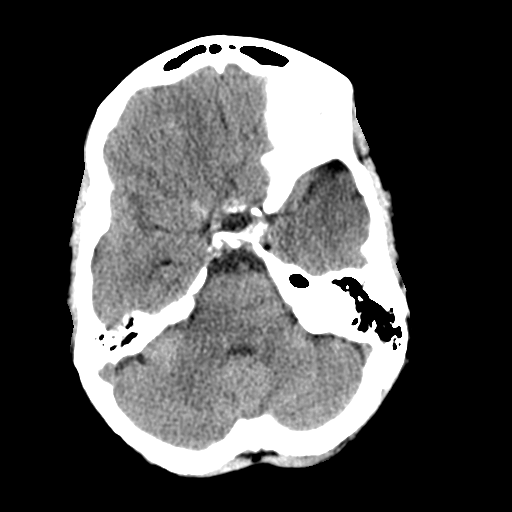
[im 10/28  brain]
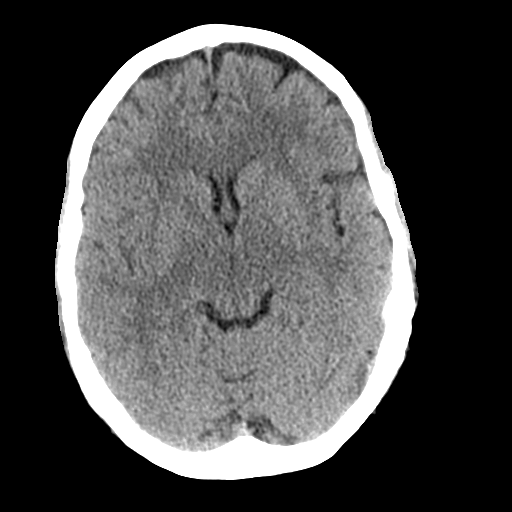
[im 13/28  brain]
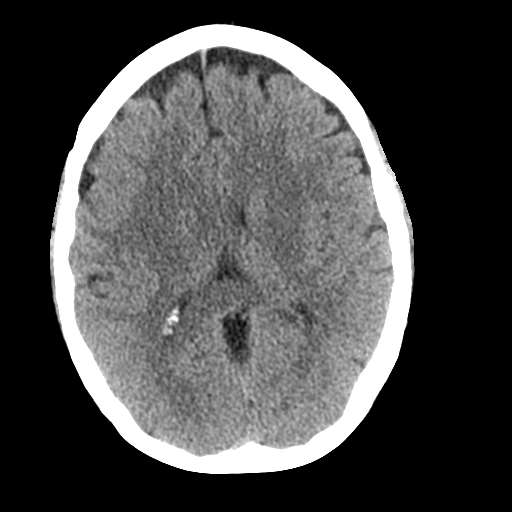
[im 16/28  brain]
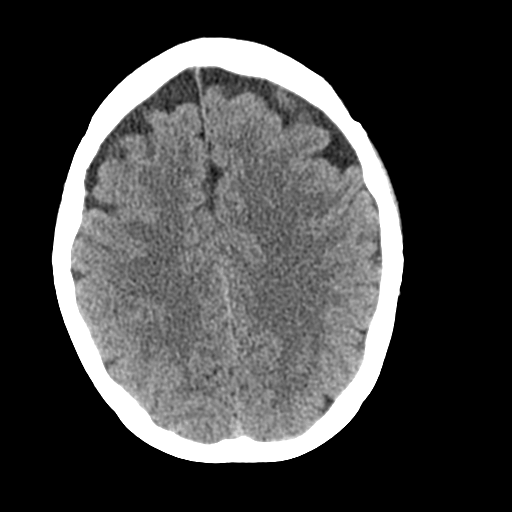
[im 16/28  bone]
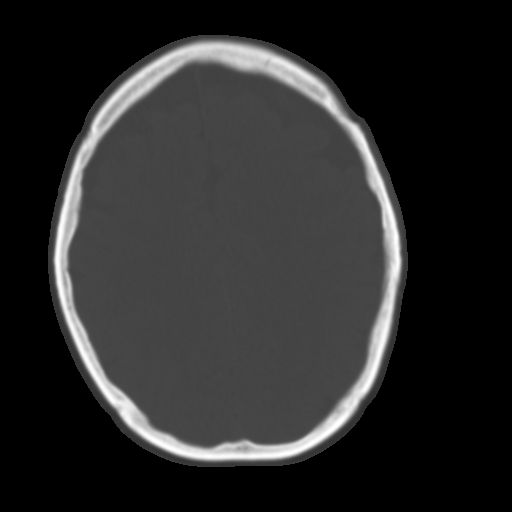
[im 19/28  brain]
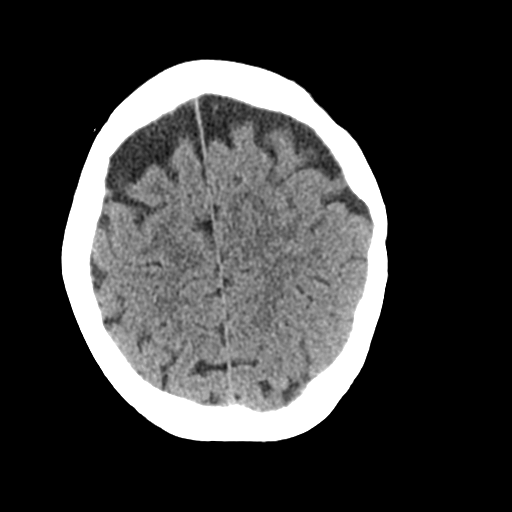
[im 23/28  brain]
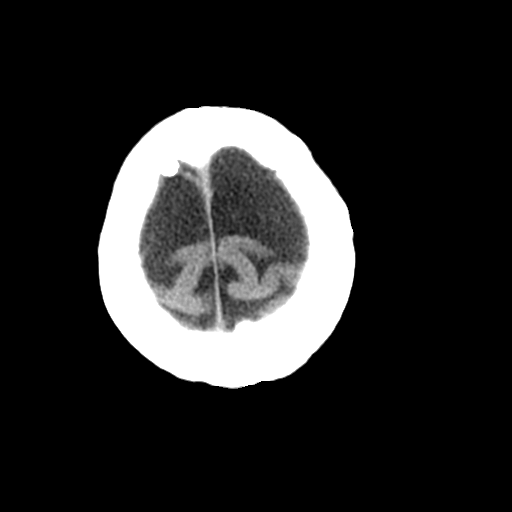
[im 26/28  brain]
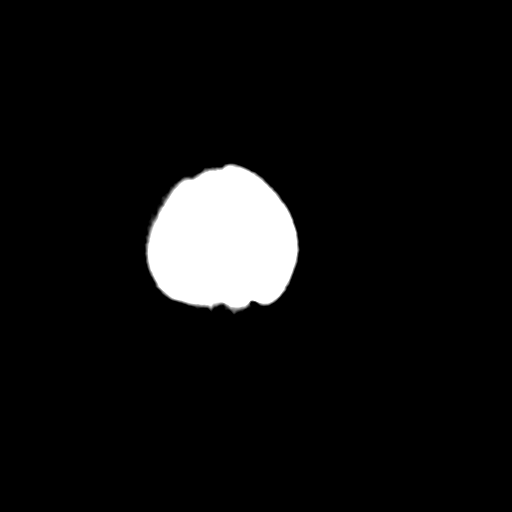

[Series 4: coronal soft tissue · coronal · 0.27mm/px · 3 of 65 slices shown]
[im 22/65  brain]
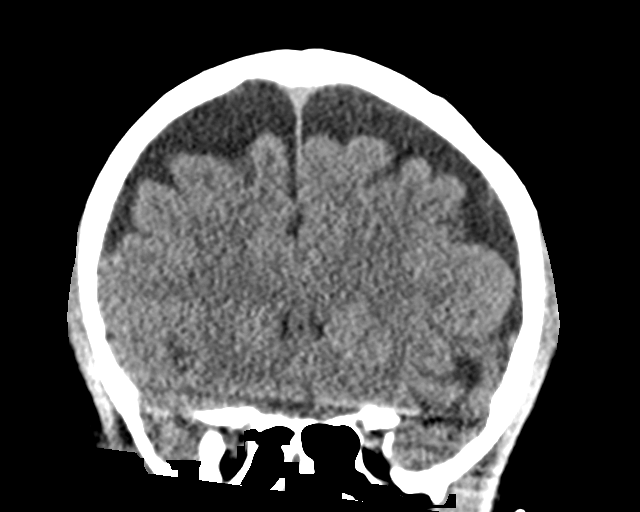
[im 29/65  brain]
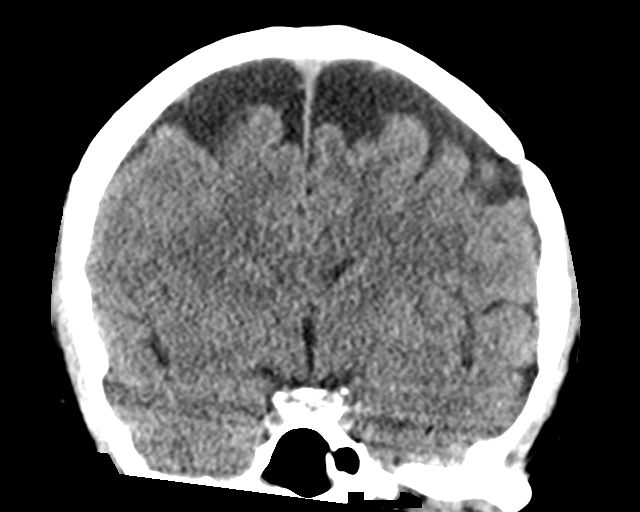
[im 36/65  brain]
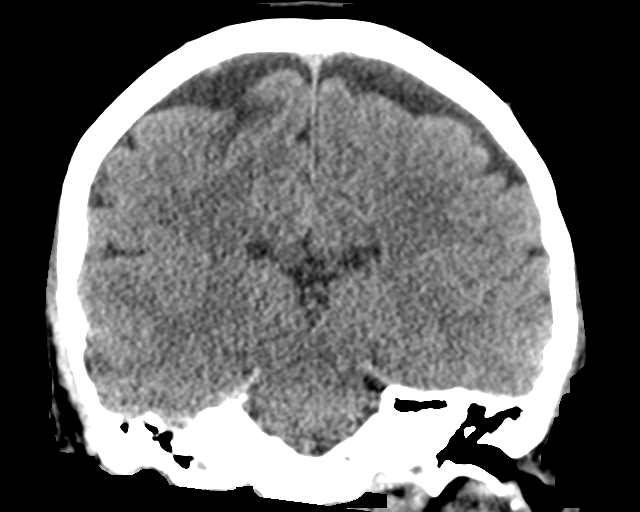

[Series 5: sagittal soft tissue · sagittal · 0.27mm/px · 3 of 57 slices shown]
[im 21/57  brain]
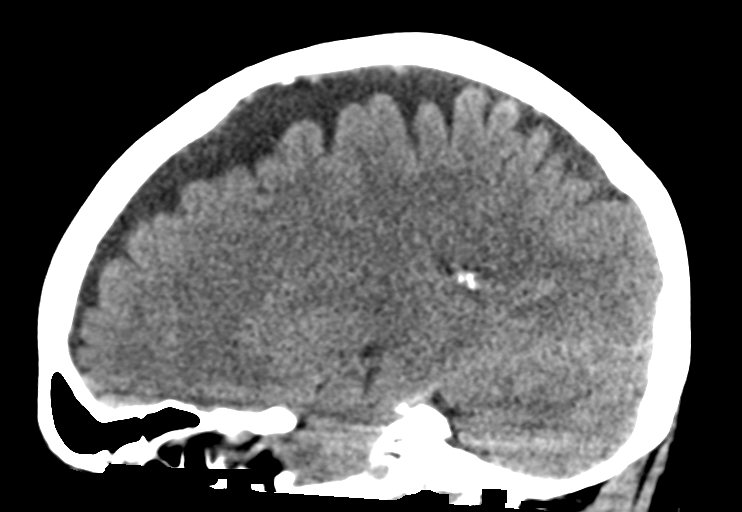
[im 29/57  brain]
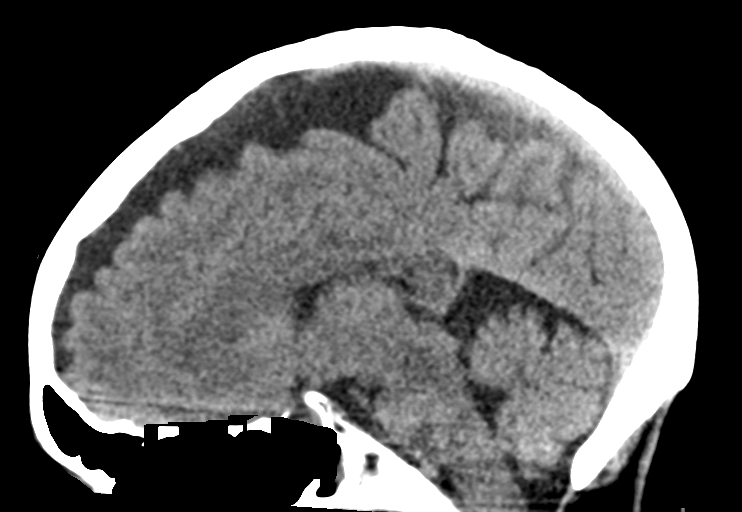
[im 37/57  brain]
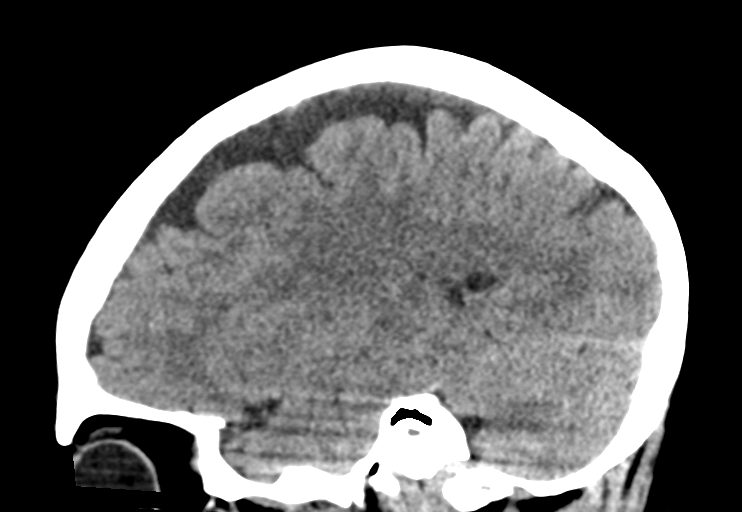

[14 of 45 positions shown; findings below may reference images not displayed]

FINDINGS: Brain:

Cerebral volume is normal for age.

Mild ill-defined hypoattenuation within the cerebral white matter is
nonspecific, but most often secondary to chronic small vessel
ischemia.

There is no acute intracranial hemorrhage.

No demarcated cortical infarct.

No extra-axial fluid collection.

No evidence of intracranial mass.

No midline shift.

Vascular: No hyperdense vessel.  Atherosclerotic calcifications.

Skull: Chronic of the left frontoparietal calvarium, which may be
posttraumatic (series 3, image 49). No acute calvarial fracture or
focal suspicious osseous lesion.

Sinuses/Orbits: Visualized orbits show no acute finding. Paranasal
sinus disease at the imaged levels. Most notably, moderate mucosal
thickening and a small air-fluid level are present within the right
sphenoid sinus.
IMPRESSION: No evidence of acute intracranial abnormality.

Redemonstrated mild cerebral white matter disease which is
nonspecific, but most often related to chronic small vessel
ischemia.

Chronic deformity of the left frontoparietal calvarium, possibly
posttraumatic. Correlate with the medical history

Paranasal sinus disease, most notably right sphenoid sinusitis.

ADDENDUM:
I was subsequently contacted by Dr. Naydia Sulph of neurology who
informed me that a code stroke was activated for this patient
shortly after my interpretation of the non-contrast head CT. There
is an ASPECTS of 10 this examination.

*** End of Addendum ***
FINDINGS: Brain:

Cerebral volume is normal for age.

Mild ill-defined hypoattenuation within the cerebral white matter is
nonspecific, but most often secondary to chronic small vessel
ischemia.

There is no acute intracranial hemorrhage.

No demarcated cortical infarct.

No extra-axial fluid collection.

No evidence of intracranial mass.

No midline shift.

Vascular: No hyperdense vessel.  Atherosclerotic calcifications.

Skull: Chronic of the left frontoparietal calvarium, which may be
posttraumatic (series 3, image 49). No acute calvarial fracture or
focal suspicious osseous lesion.

Sinuses/Orbits: Visualized orbits show no acute finding. Paranasal
sinus disease at the imaged levels. Most notably, moderate mucosal
thickening and a small air-fluid level are present within the right
sphenoid sinus.
IMPRESSION: No evidence of acute intracranial abnormality.

Redemonstrated mild cerebral white matter disease which is
nonspecific, but most often related to chronic small vessel
ischemia.

Chronic deformity of the left frontoparietal calvarium, possibly
posttraumatic. Correlate with the medical history

Paranasal sinus disease, most notably right sphenoid sinusitis.
